# Patient Record
Sex: Male | Born: 1970
Health system: Southern US, Community
[De-identification: ages and names within clinical notes are randomized; demographics above are authoritative.]

## PROBLEM LIST (undated history)

## (undated) DIAGNOSIS — C801 Malignant (primary) neoplasm, unspecified: Secondary | ICD-10-CM

## (undated) DIAGNOSIS — R310 Gross hematuria: Secondary | ICD-10-CM

## (undated) DIAGNOSIS — N3289 Other specified disorders of bladder: Secondary | ICD-10-CM

## (undated) HISTORY — PX: WISDOM TOOTH EXTRACTION: SHX21

---

## 2004-04-19 HISTORY — PX: TONSILLECTOMY AND ADENOIDECTOMY: SHX28

## 2011-06-04 ENCOUNTER — Ambulatory Visit (INDEPENDENT_AMBULATORY_CARE_PROVIDER_SITE_OTHER): Payer: BC Managed Care – PPO | Admitting: Family Medicine

## 2011-06-04 ENCOUNTER — Encounter: Payer: Self-pay | Admitting: Family Medicine

## 2011-06-04 ENCOUNTER — Ambulatory Visit
Admission: RE | Admit: 2011-06-04 | Discharge: 2011-06-04 | Disposition: A | Discharge status: Home / Self Care | Payer: BC Managed Care – PPO | Source: Ambulatory Visit | Attending: Family Medicine | Admitting: Family Medicine

## 2011-06-04 VITALS — BP 147/96 | HR 72 | Ht 70.0 in | Wt 320.0 lb

## 2011-06-04 DIAGNOSIS — M7650 Patellar tendinitis, unspecified knee: Secondary | ICD-10-CM

## 2011-06-04 MED ORDER — KETOPROFEN POWD: Status: DC

## 2011-06-04 NOTE — Progress Notes (Signed)
  Subjective:    Patient ID: Jesus Soto, male    DOB: 28-Mar-1971, 41 y.o.   MRN: [id number]  HPI  Pain on anterior left knee after hitting a post while playing tennis 2 weeks ago, he was able to continue playing. He has been using motrin and ice which has helped. Last Friday he went for a 3 mile walk and his knee pain got worse. He described the pain in the anterior aspect of the knee, 3 intensity, worse with direct out and certain activities. He is able to walk and function of his knee is good. Mild swelling in the front of his knee. No numbness or tingling  There is no problem list on file for this patient.  No current outpatient prescriptions on file prior to visit.   No Known Allergies     Review of Systems  Constitutional: Negative for fever, chills, diaphoresis and fatigue.  Musculoskeletal: Negative for myalgias, back pain, joint swelling and gait problem.  Neurological: Positive for numbness. Negative for weakness.       Objective:   Physical Exam  Constitutional: He is oriented to person, place, and time. He appears well-developed and well-nourished.       BP 147/96  Pulse 72  Ht 5\' 10"  (1.778 m)  Wt 320 lb (145.151 kg)  BMI 45.92 kg/m2   Pulmonary/Chest: Effort normal.  Musculoskeletal:       Left knee with intact the skin. Through range of motion. TTP the patient in the inferior pole of the patella, tenderness to palpation along the  proximal patellar tendon. Mild tenderness to palpation in the medial joint line. Ligaments are intact. Neurovascularly intact Gait independent without a limp  Neurological: He is alert and oriented to person, place, and time.  Skin: Skin is warm. No rash noted. No erythema. No pallor.  Psychiatric: He has a normal mood and affect. His behavior is normal.    MSK U/S : The patellar tendon is intact. Teres and mild hyperechoic halo around the patellar tendon. There is any interruption in the anterior cortical of the patella  subjective both a anterior cortex patella avulsion fracture.  No joint effusion. The medial meniscus looks intact      Assessment & Plan:   1. Tendonitis, patellar  DG Knee AP/LAT W/Sunrise Left, Ketoprofen POWD   Recommended ice massage 20 min bid Ketoprofen compound Patellar tendinitis exercises. Use patellar strap daily  X ray of the knee show a avulsion fracture of a old patellar calcification.

## 2013-02-25 ENCOUNTER — Ambulatory Visit: Payer: BC Managed Care – PPO

## 2013-05-09 ENCOUNTER — Encounter: Payer: Self-pay | Admitting: Physician Assistant

## 2013-05-09 ENCOUNTER — Ambulatory Visit (INDEPENDENT_AMBULATORY_CARE_PROVIDER_SITE_OTHER): Payer: BC Managed Care – PPO | Admitting: Physician Assistant

## 2013-05-09 VITALS — BP 140/100 | HR 65 | Temp 97.9°F | Resp 16 | Ht 68.5 in | Wt 332.0 lb

## 2013-05-09 DIAGNOSIS — Z0000 Encounter for general adult medical examination without abnormal findings: Secondary | ICD-10-CM

## 2013-05-09 DIAGNOSIS — J329 Chronic sinusitis, unspecified: Secondary | ICD-10-CM

## 2013-05-09 DIAGNOSIS — M533 Sacrococcygeal disorders, not elsewhere classified: Secondary | ICD-10-CM

## 2013-05-09 NOTE — Progress Notes (Signed)
Pre visit review using our clinic review tool, if applicable. No additional management support is needed unless otherwise documented below in the visit note. 

## 2013-05-09 NOTE — Progress Notes (Signed)
Patient ID: Jesus Soto, male   DOB: [birth year], 43 y.o.   MRN: [id number]  Patient presents to clinic today to establish care.  Acute Concerns: Patient presents "butt-bone" tenderness with prolonged sitting that has been present for around 4 months.  Patient does have a very sedentary job. Patient is morbidly obese with Body mass index is 49.74 kg/(m^2). Patient endorses pain improves with standing and ambulation.  Denies trauma or injury.  Denies numbness or tingling.  Has not taken anything for symptoms.  Patient also c/o sinus pressure x 2 days.  Denies fever, chills, shortness of breath or wheezing.  Denies ear pain, tooth pain, sick contact or recent travel.  Chronic Issues: Denies known significant PMH.  Health Maintenance: Dental -- UTD Vision --UTD Immunizations -- declined flu shot.  History reviewed. No pertinent past medical history.  Past Surgical History  Procedure Laterality Date  . Tonsillectomy and adenoidectomy  2004    Pt states he also had his uvula shaved at this time.    No current outpatient prescriptions on file prior to visit.   No current facility-administered medications on file prior to visit.    No Known Allergies  Family History  Problem Relation Age of Onset  . Cancer Mother     ovarian  . Multiple myeloma Father   . Cancer Maternal Uncle     PROSTATE  . Cancer Paternal Uncle     PROSTATE  . Parkinson's disease Paternal Uncle   . Heart disease Paternal Grandmother     History   Social History  . Marital Status: Married    Spouse Name: N/A    Number of Children: N/A  . Years of Education: N/A   Occupational History  . Not on file.   Social History Main Topics  . Smoking status: Former Smoker    Quit date: 02/17/2010  . Smokeless tobacco: Never Used  . Alcohol Use: Yes     Comment: rare  . Drug Use: No  . Sexual Activity: Yes    Birth Control/ Protection: None     Comment: women -- wife   Other Topics Concern  . Not on  file   Social History Narrative  . No narrative on file   Review of Systems  Constitutional: Negative for fever and weight loss.  HENT: Negative for ear discharge, ear pain, hearing loss and tinnitus.   Eyes: Negative for blurred vision, double vision, photophobia and pain.  Respiratory: Negative for cough and shortness of breath.   Cardiovascular: Negative for chest pain and palpitations.  Gastrointestinal: Negative for heartburn, nausea, vomiting, abdominal pain, diarrhea, constipation, blood in stool and melena.  Genitourinary: Negative for dysuria, urgency, frequency, hematuria and flank pain.       Nocturia x 0; no problems with erectile function  Musculoskeletal: Positive for joint pain.  Neurological: Negative for dizziness, seizures, loss of consciousness and headaches.  Endo/Heme/Allergies: Negative for environmental allergies.  Psychiatric/Behavioral: Negative for depression, suicidal ideas, hallucinations and substance abuse. The patient is not nervous/anxious and does not have insomnia.     BP 140/100  Pulse 65  Temp(Src) 97.9 F (36.6 C) (Oral)  Resp 16  Ht 5' 8.5" (1.74 m)  Wt 332 lb (150.594 kg)  BMI 49.74 kg/m2  SpO2 99%  Physical Exam  Constitutional: He is oriented to person, place, and time and well-developed, well-nourished, and in no distress.  HENT:  Head: Normocephalic and atraumatic.  Right Ear: External ear normal.  Left Ear: External ear normal.  Nose: Nose normal.  Mouth/Throat: Oropharynx is clear and moist. No oropharyngeal exudate.  TM within normal limits bilaterally.  No tenderness to percussion of sinuses.  Eyes: Conjunctivae and EOM are normal. Pupils are equal, round, and reactive to light.  Neck: Neck supple.  Cardiovascular: Normal rate, regular rhythm, normal heart sounds and intact distal pulses.   Pulmonary/Chest: Effort normal and breath sounds normal. No respiratory distress. He has no wheezes. He has no rales. He exhibits no  tenderness.  Abdominal: Soft. Bowel sounds are normal. He exhibits no distension and no mass. There is no tenderness. There is no rebound and no guarding.  Musculoskeletal:       Cervical back: Normal.       Thoracic back: Normal.       Lumbar back: Normal.  Lymphadenopathy:    He has no cervical adenopathy.  Neurological: He is alert and oriented to person, place, and time. No cranial nerve deficit.  Skin: Skin is warm and dry. No rash noted.  Psychiatric: Affect normal.    No results found for this or any previous visit (from the past 2160 hour(s)).  Assessment/Plan: Sinusitis 2 days of symptoms.  Most likely viral. Increase fluid intake.  Rest.  Saline nasal spray.  Mucinex. Humidifier in bedroom.  Call or return to clinic if symptoms are not improving.  Visit for preventive health examination History reviewed.  Declines immunizations.  Will obtain fasting lipid profile.  Coccygeal pain Likely 2/2 morbid obesity and sedentary lifestyle.  Patient instructed on stretching exercises.  Donut pillow.  Encourage exercise and weight loss.  Alternate tylenol and ibuprofen.  Will obtain x-ray of coccyx if symptoms are not improving with conservative measures.

## 2013-05-09 NOTE — Patient Instructions (Signed)
Please return in 2 weeks for a blood pressure recheck.  Please read information below on the DASH diet for high blood pressure.  I recommend 30 minutes of exercise, at least 3 times per week to help with weight loss and reduction in BP.  For the "tail-bone" pain, lean slightly forward when sitting, as this alleviated pressure on the coccyx (tailbone).  Take an ibuprofen as needed. Again, weight loss will be very benficial in preventing a worsening of your symptoms.  Please let me know if symptoms are worsening.  Also, don't forget to return to lab for blood work.  I will call you with all of your results.  It was a pleasure participating in your care today.

## 2013-05-13 DIAGNOSIS — Z0000 Encounter for general adult medical examination without abnormal findings: Secondary | ICD-10-CM | POA: Insufficient documentation

## 2013-05-13 DIAGNOSIS — J329 Chronic sinusitis, unspecified: Secondary | ICD-10-CM | POA: Insufficient documentation

## 2013-05-13 DIAGNOSIS — M533 Sacrococcygeal disorders, not elsewhere classified: Secondary | ICD-10-CM | POA: Insufficient documentation

## 2013-05-13 NOTE — Assessment & Plan Note (Signed)
2 days of symptoms.  Most likely viral. Increase fluid intake.  Rest.  Saline nasal spray.  Mucinex. Humidifier in bedroom.  Call or return to clinic if symptoms are not improving.

## 2013-05-13 NOTE — Assessment & Plan Note (Signed)
Likely 2/2 morbid obesity and sedentary lifestyle.  Patient instructed on stretching exercises.  Donut pillow.  Encourage exercise and weight loss.  Alternate tylenol and ibuprofen.  Will obtain x-ray of coccyx if symptoms are not improving with conservative measures.

## 2013-05-13 NOTE — Assessment & Plan Note (Signed)
History reviewed.  Declines immunizations.  Will obtain fasting lipid profile.

## 2013-05-21 LAB — CBC WITH DIFFERENTIAL/PLATELET
BASOS ABS: 0 10*3/uL (ref 0.0–0.1)
BASOS PCT: 1 % (ref 0–1)
EOS ABS: 0.1 10*3/uL (ref 0.0–0.7)
Eosinophils Relative: 2 % (ref 0–5)
HCT: 44.8 % (ref 39.0–52.0)
HEMOGLOBIN: 15.9 g/dL (ref 13.0–17.0)
Lymphocytes Relative: 31 % (ref 12–46)
Lymphs Abs: 1.8 10*3/uL (ref 0.7–4.0)
MCH: 30.2 pg (ref 26.0–34.0)
MCHC: 35.5 g/dL (ref 30.0–36.0)
MCV: 85 fL (ref 78.0–100.0)
MONOS PCT: 7 % (ref 3–12)
Monocytes Absolute: 0.4 10*3/uL (ref 0.1–1.0)
NEUTROS PCT: 59 % (ref 43–77)
Neutro Abs: 3.5 10*3/uL (ref 1.7–7.7)
Platelets: 152 10*3/uL (ref 150–400)
RBC: 5.27 MIL/uL (ref 4.22–5.81)
RDW: 14 % (ref 11.5–15.5)
WBC: 5.9 10*3/uL (ref 4.0–10.5)

## 2013-05-21 LAB — HEMOGLOBIN A1C
HEMOGLOBIN A1C: 5.3 % (ref <5.7)
Mean Plasma Glucose: 105 mg/dL (ref <117)

## 2013-05-21 LAB — BASIC METABOLIC PANEL WITH GFR
BUN: 17 mg/dL (ref 6–23)
CALCIUM: 8.9 mg/dL (ref 8.4–10.5)
CO2: 26 mEq/L (ref 19–32)
Chloride: 105 mEq/L (ref 96–112)
Creat: 0.9 mg/dL (ref 0.50–1.35)
GLUCOSE: 99 mg/dL (ref 70–99)
Potassium: 4 mEq/L (ref 3.5–5.3)
SODIUM: 138 meq/L (ref 135–145)

## 2013-05-21 LAB — LIPID PANEL
Cholesterol: 221 mg/dL — ABNORMAL HIGH (ref 0–200)
HDL: 28 mg/dL — ABNORMAL LOW (ref >39)
Total CHOL/HDL Ratio: 7.9 Ratio
Triglycerides: 496 mg/dL — ABNORMAL HIGH (ref <150)

## 2013-05-21 LAB — HEPATIC FUNCTION PANEL
ALT: 26 U/L (ref 0–53)
AST: 20 U/L (ref 0–37)
Albumin: 4 g/dL (ref 3.5–5.2)
Alkaline Phosphatase: 75 U/L (ref 39–117)
BILIRUBIN DIRECT: 0.1 mg/dL (ref 0.0–0.3)
Indirect Bilirubin: 0.6 mg/dL (ref 0.2–1.2)
TOTAL PROTEIN: 6.4 g/dL (ref 6.0–8.3)
Total Bilirubin: 0.7 mg/dL (ref 0.2–1.2)

## 2013-05-21 LAB — TSH: TSH: 2.198 u[IU]/mL (ref 0.350–4.500)

## 2013-05-22 LAB — URINALYSIS, ROUTINE W REFLEX MICROSCOPIC
BILIRUBIN URINE: NEGATIVE
Glucose, UA: NEGATIVE mg/dL
Hgb urine dipstick: NEGATIVE
KETONES UR: NEGATIVE mg/dL
Leukocytes, UA: NEGATIVE
Nitrite: NEGATIVE
PROTEIN: NEGATIVE mg/dL
Specific Gravity, Urine: 1.022 (ref 1.005–1.030)
UROBILINOGEN UA: 0.2 mg/dL (ref 0.0–1.0)
pH: 5.5 (ref 5.0–8.0)

## 2013-05-23 ENCOUNTER — Ambulatory Visit: Payer: BC Managed Care – PPO | Admitting: Physician Assistant

## 2013-05-23 ENCOUNTER — Telehealth: Payer: Self-pay | Admitting: Physician Assistant

## 2013-05-23 DIAGNOSIS — E781 Pure hyperglyceridemia: Secondary | ICD-10-CM

## 2013-05-23 MED ORDER — ATORVASTATIN CALCIUM 20 MG PO TABS: 20.0000 mg | ORAL_TABLET | Freq: Every day | ORAL | Status: DC

## 2013-05-23 NOTE — Telephone Encounter (Signed)
Patient did not show up for his appointment on 05/23/13.  I was going to go over his recent labs with him.  Overall, labs look good.  He did have extremely elevated triglycerides and his LDL (lousy) cholesterol could not be calculated because his triglycerides were so elevated.  I need for him to return to lab fasting so we can recheck his cholesterol levels (I have placed the orders).  For his high triglycerides I am placing him on Lipitor 20 mg daily.  He needs to avoid sugary foods, foods high in cholesterol, and avoid alcohol consumption.  It is important that we get his triglycerides lowered because elevated TGL are associated with pancreatitis.  Follow-up in 1 week for BP recheck since he missed his appointment.  We can further discuss results and medications at that time.

## 2013-05-24 NOTE — Telephone Encounter (Signed)
Patient came in to office for appt on wrong date on 02.05.15 [appt was 02.04.15 at 5:15pm], went over results and discussed new medication; rescheduled for Wed, 02.18.15 at 5:30p/SLS

## 2013-06-06 ENCOUNTER — Ambulatory Visit (INDEPENDENT_AMBULATORY_CARE_PROVIDER_SITE_OTHER): Payer: BC Managed Care – PPO | Admitting: Physician Assistant

## 2013-06-06 ENCOUNTER — Encounter: Payer: Self-pay | Admitting: Physician Assistant

## 2013-06-06 VITALS — BP 134/98 | HR 72 | Temp 98.4°F | Resp 16 | Ht 68.5 in | Wt 331.2 lb

## 2013-06-06 DIAGNOSIS — E781 Pure hyperglyceridemia: Secondary | ICD-10-CM

## 2013-06-06 DIAGNOSIS — I10 Essential (primary) hypertension: Secondary | ICD-10-CM | POA: Insufficient documentation

## 2013-06-06 NOTE — Assessment & Plan Note (Addendum)
Discussed new diagnoses with patient, including treatment options. Patient has elected to attempt a two-month trial of therapeutic lifestyle changes. Patient given handout on hypertension and DASH diet. Encourage 30 minutes of aerobic exercise at least 3 times a week. Followup in 2 months.  If BP persistently elevated despite therapeutic lifestyle changes, we will need to initiate an antihypertensive agent

## 2013-06-06 NOTE — Progress Notes (Signed)
Pre visit review using our clinic review tool, if applicable. No additional management support is needed unless otherwise documented below in the visit note/SLS  

## 2013-06-06 NOTE — Patient Instructions (Signed)
Please return for fasting labs.  This is very important.  Please read information below on Hypertension and the DASH diet.  We will follow-up in 1-2 months.  If BP is still elevated despite lifestyle changes we will need to consider medication for BP to reduce risk of stroke and heart attack.  Hypertension As your heart beats, it forces blood through your arteries. This force is your blood pressure. If the pressure is too high, it is called hypertension (HTN) or high blood pressure. HTN is dangerous because you may have it and not know it. High blood pressure may mean that your heart has to work harder to pump blood. Your arteries may be narrow or stiff. The extra work puts you at risk for heart disease, stroke, and other problems.  Blood pressure consists of two numbers, a higher number over a lower, 110/72, for example. It is stated as "110 over 72." The ideal is below 120 for the top number (systolic) and under 80 for the bottom (diastolic). Write down your blood pressure today. You should pay close attention to your blood pressure if you have certain conditions such as:  Heart failure.  Prior heart attack.  Diabetes  Chronic kidney disease.  Prior stroke.  Multiple risk factors for heart disease. To see if you have HTN, your blood pressure should be measured while you are seated with your arm held at the level of the heart. It should be measured at least twice. A one-time elevated blood pressure reading (especially in the Emergency Department) does not mean that you need treatment. There may be conditions in which the blood pressure is different between your right and left arms. It is important to see your caregiver soon for a recheck. Most people have essential hypertension which means that there is not a specific cause. This type of high blood pressure may be lowered by changing lifestyle factors such as:  Stress.  Smoking.  Lack of exercise.  Excessive  weight.  Drug/tobacco/alcohol use.  Eating less salt. Most people do not have symptoms from high blood pressure until it has caused damage to the body. Effective treatment can often prevent, delay or reduce that damage. TREATMENT  When a cause has been identified, treatment for high blood pressure is directed at the cause. There are a large number of medications to treat HTN. These fall into several categories, and your caregiver will help you select the medicines that are best for you. Medications may have side effects. You should review side effects with your caregiver. If your blood pressure stays high after you have made lifestyle changes or started on medicines,   Your medication(s) may need to be changed.  Other problems may need to be addressed.  Be certain you understand your prescriptions, and know how and when to take your medicine.  Be sure to follow up with your caregiver within the time frame advised (usually within two weeks) to have your blood pressure rechecked and to review your medications.  If you are taking more than one medicine to lower your blood pressure, make sure you know how and at what times they should be taken. Taking two medicines at the same time can result in blood pressure that is too low. SEEK IMMEDIATE MEDICAL CARE IF:  You develop a severe headache, blurred or changing vision, or confusion.  You have unusual weakness or numbness, or a faint feeling.  You have severe chest or abdominal pain, vomiting, or breathing problems. MAKE SURE YOU:  Understand these instructions.  Will watch your condition.  Will get help right away if you are not doing well or get worse. Document Released: 04/05/2005 Document Revised: 06/28/2011 Document Reviewed: 11/24/2007 [facility] Patient Information 2014 [place].  DASH Diet The DASH diet stands for "Dietary Approaches to Stop Hypertension." It is a healthy eating plan that has been shown to reduce high  blood pressure (hypertension) in as little as 14 days, while also possibly providing other significant health benefits. These other health benefits include reducing the risk of breast cancer after menopause and reducing the risk of type 2 diabetes, heart disease, colon cancer, and stroke. Health benefits also include weight loss and slowing kidney failure in patients with chronic kidney disease.  DIET GUIDELINES  Limit salt (sodium). Your diet should contain less than 1500 mg of sodium daily.  Limit refined or processed carbohydrates. Your diet should include mostly whole grains. Desserts and added sugars should be used sparingly.  Include small amounts of heart-healthy fats. These types of fats include nuts, oils, and tub margarine. Limit saturated and trans fats. These fats have been shown to be harmful in the body. CHOOSING FOODS  The following food groups are based on a 2000 calorie diet. See your Registered Dietitian for individual calorie needs. Grains and Grain Products (6 to 8 servings daily)  Eat More Often: Whole-wheat bread, brown rice, whole-grain or wheat pasta, quinoa, popcorn without added fat or salt (air popped).  Eat Less Often: White bread, white pasta, white rice, cornbread. Vegetables (4 to 5 servings daily)  Eat More Often: Fresh, frozen, and canned vegetables. Vegetables may be raw, steamed, roasted, or grilled with a minimal amount of fat.  Eat Less Often/Avoid: Creamed or fried vegetables. Vegetables in a cheese sauce. Fruit (4 to 5 servings daily)  Eat More Often: All fresh, canned (in natural juice), or frozen fruits. Dried fruits without added sugar. One hundred percent fruit juice ( cup [237 mL] daily).  Eat Less Often: Dried fruits with added sugar. Canned fruit in light or heavy syrup. [employer], Fish, and Poultry (2 servings or less daily. One serving is 3 to 4 oz [85-114 g]).  Eat More Often: Ninety percent or leaner ground beef, tenderloin, sirloin.  Round cuts of beef, chicken breast, [place] breast. All fish. Grill, bake, or broil your meat. Nothing should be fried.  Eat Less Often/Avoid: Fatty cuts of meat, [place], or chicken leg, thigh, or wing. Fried cuts of meat or fish. Dairy (2 to 3 servings)  Eat More Often: Low-fat or fat-free milk, low-fat plain or light yogurt, reduced-fat or part-skim cheese.  Eat Less Often/Avoid: Milk (whole, 2%).Whole milk yogurt. Full-fat cheeses. Nuts, Seeds, and Legumes (4 to 5 servings per week)  Eat More Often: All without added salt.  Eat Less Often/Avoid: Salted nuts and seeds, canned beans with added salt. Fats and Sweets (limited)  Eat More Often: Vegetable oils, tub margarines without trans fats, sugar-free gelatin. Mayonnaise and salad dressings.  Eat Less Often/Avoid: Coconut oils, palm oils, butter, stick margarine, cream, half and half, cookies, candy, pie. FOR MORE INFORMATION The Dash Diet Eating Plan: www.[email or web address].org Document Released: 03/25/2011 Document Revised: 06/28/2011 Document Reviewed: 03/25/2011 [facility] Patient Information 2014 [place], [place].  Hypertriglyceridemia  Diet for High blood levels of Triglycerides Most fats in food are triglycerides. Triglycerides in your blood are stored as fat in your body. High levels of triglycerides in your blood may put you at a greater risk for heart disease and stroke.  Normal  triglyceride levels are less than 150 mg/dL. Borderline high levels are 150-199 mg/dl. High levels are 200 - 499 mg/dL, and very high triglyceride levels are greater than 500 mg/dL. The decision to treat high triglycerides is generally based on the level. For people with borderline or high triglyceride levels, treatment includes weight loss and exercise. Drugs are recommended for people with very high triglyceride levels. Many people who need treatment for high triglyceride levels have metabolic syndrome. This syndrome is a collection of disorders that often  include: insulin resistance, high blood pressure, blood clotting problems, high cholesterol and triglycerides. TESTING PROCEDURE FOR TRIGLYCERIDES  You should not eat 4 hours before getting your triglycerides measured. The normal range of triglycerides is between 10 and 250 milligrams per deciliter (mg/dl). Some people may have extreme levels (1000 or above), but your triglyceride level may be too high if it is above 150 mg/dl, depending on what other risk factors you have for heart disease.  People with high blood triglycerides may also have high blood cholesterol levels. If you have high blood cholesterol as well as high blood triglycerides, your risk for heart disease is probably greater than if you only had high triglycerides. High blood cholesterol is one of the main risk factors for heart disease. CHANGING YOUR DIET  Your weight can affect your blood triglyceride level. If you are more than 20% above your ideal body weight, you may be able to lower your blood triglycerides by losing weight. Eating less and exercising regularly is the best way to combat this. Fat provides more calories than any other food. The best way to lose weight is to eat less fat. Only 30% of your total calories should come from fat. Less than 7% of your diet should come from saturated fat. A diet low in fat and saturated fat is the same as a diet to decrease blood cholesterol. By eating a diet lower in fat, you may lose weight, lower your blood cholesterol, and lower your blood triglyceride level.  Eating a diet low in fat, especially saturated fat, may also help you lower your blood triglyceride level. Ask your dietitian to help you figure how much fat you can eat based on the number of calories your caregiver has prescribed for you.  Exercise, in addition to helping with weight loss may also help lower triglyceride levels.   Alcohol can increase blood triglycerides. You may need to stop drinking alcoholic beverages.  Too  much carbohydrate in your diet may also increase your blood triglycerides. Some complex carbohydrates are necessary in your diet. These may include bread, rice, potatoes, other starchy vegetables and cereals.  Reduce "simple" carbohydrates. These may include pure sugars, candy, honey, and jelly without losing other nutrients. If you have the kind of high blood triglycerides that is affected by the amount of carbohydrates in your diet, you will need to eat less sugar and less high-sugar foods. Your caregiver can help you with this.  Adding 2-4 grams of fish oil (EPA+ DHA) may also help lower triglycerides. Speak with your caregiver before adding any supplements to your regimen. Following the Diet  Maintain your ideal weight. Your caregivers can help you with a diet. Generally, eating less food and getting more exercise will help you lose weight. Joining a weight control group may also help. Ask your caregivers for a good weight control group in your area.  Eat low-fat foods instead of high-fat foods. This can help you lose weight too.  These foods are lower  in fat. Eat MORE of these:   Dried beans, peas, and lentils.  Egg whites.  Low-fat cottage cheese.  Fish.  Lean cuts of meat, such as round, sirloin, rump, and flank (cut extra fat off meat you fix).  Whole grain breads, cereals and pasta.  Skim and nonfat dry milk.  Low-fat yogurt.  Poultry without the skin.  Cheese made with skim or part-skim milk, such as mozzarella, parmesan, farmers', ricotta, or pot cheese. These are higher fat foods. Eat LESS of these:   Whole milk and foods made from whole milk, such as American, blue, cheddar, monterey jack, and swiss cheese  High-fat meats, such as luncheon meats, sausages, knockwurst, bratwurst, hot dogs, ribs, corned beef, ground pork, and regular ground beef.  Fried foods. Limit saturated fats in your diet. Substituting unsaturated fat for saturated fat may decrease your blood  triglyceride level. You will need to read package labels to know which products contain saturated fats.  These foods are high in saturated fat. Eat LESS of these:   Fried pork skins.  Whole milk.  Skin and fat from poultry.  Palm oil.  Butter.  Shortening.  Cream cheese.  Berniece Salines.  Margarines and baked goods made from listed oils.  Vegetable shortenings.  Chitterlings.  Fat from meats.  Coconut oil.  Palm kernel oil.  Lard.  Cream.  Sour cream.  Fatback.  Coffee whiteners and non-dairy creamers made with these oils.  Cheese made from whole milk. Use unsaturated fats (both polyunsaturated and monounsaturated) moderately. Remember, even though unsaturated fats are better than saturated fats; you still want a diet low in total fat.  These foods are high in unsaturated fat:   Canola oil.  Sunflower oil.  Mayonnaise.  Almonds.  Peanuts.  Pine nuts.  Margarines made with these oils.  Safflower oil.  Olive oil.  Avocados.  Cashews.  Peanut butter.  Sunflower seeds.  Soybean oil.  Peanut oil.  Olives.  Pecans.  Walnuts.  Pumpkin seeds. Avoid sugar and other high-sugar foods. This will decrease carbohydrates without decreasing other nutrients. Sugar in your food goes rapidly to your blood. When there is excess sugar in your blood, your liver may use it to make more triglycerides. Sugar also contains calories without other important nutrients.  Eat LESS of these:   Sugar, brown sugar, powdered sugar, jam, jelly, preserves, honey, syrup, molasses, pies, candy, cakes, cookies, frosting, pastries, colas, soft drinks, punches, fruit drinks, and regular gelatin.  Avoid alcohol. Alcohol, even more than sugar, may increase blood triglycerides. In addition, alcohol is high in calories and low in nutrients. Ask for sparkling water, or a diet soft drink instead of an alcoholic beverage. Suggestions for planning and preparing meals   Bake, broil,  grill or roast meats instead of frying.  Remove fat from meats and skin from poultry before cooking.  Add spices, herbs, lemon juice or vinegar to vegetables instead of salt, rich sauces or gravies.  Use a non-stick skillet without fat or use no-stick sprays.  Cool and refrigerate stews and broth. Then remove the hardened fat floating on the surface before serving.  Refrigerate meat drippings and skim off fat to make low-fat gravies.  Serve more fish.  Use less butter, margarine and other high-fat spreads on bread or vegetables.  Use skim or reconstituted non-fat dry milk for cooking.  Cook with low-fat cheeses.  Substitute low-fat yogurt or cottage cheese for all or part of the sour cream in recipes for sauces, dips or congealed  salads.  Use half yogurt/half mayonnaise in salad recipes.  Substitute evaporated skim milk for cream. Evaporated skim milk or reconstituted non-fat dry milk can be whipped and substituted for whipped cream in certain recipes.  Choose fresh fruits for dessert instead of high-fat foods such as pies or cakes. Fruits are naturally low in fat. When Dining Out   Order low-fat appetizers such as fruit or vegetable juice, pasta with vegetables or tomato sauce.  Select clear, rather than cream soups.  Ask that dressings and gravies be served on the side. Then use less of them.  Order foods that are baked, broiled, poached, steamed, stir-fried, or roasted.  Ask for margarine instead of butter, and use only a small amount.  Drink sparkling water, unsweetened tea or coffee, or diet soft drinks instead of alcohol or other sweet beverages. QUESTIONS AND ANSWERS ABOUT OTHER FATS IN THE BLOOD: SATURATED FAT, TRANS FAT, AND CHOLESTEROL What is trans fat? Trans fat is a type of fat that is formed when vegetable oil is hardened through a process called hydrogenation. This process helps makes foods more solid, gives them shape, and prolongs their shelf life. Trans  fats are also called hydrogenated or partially hydrogenated oils.  What do saturated fat, trans fat, and cholesterol in foods have to do with heart disease? Saturated fat, trans fat, and cholesterol in the diet all raise the level of LDL "bad" cholesterol in the blood. The higher the LDL cholesterol, the greater the risk for coronary heart disease (CHD). Saturated fat and trans fat raise LDL similarly.  What foods contain saturated fat, trans fat, and cholesterol? High amounts of saturated fat are found in animal products, such as fatty cuts of meat, chicken skin, and full-fat dairy products like butter, whole milk, cream, and cheese, and in tropical vegetable oils such as palm, palm kernel, and coconut oil. Trans fat is found in some of the same foods as saturated fat, such as vegetable shortening, some margarines (especially hard or stick margarine), crackers, cookies, baked goods, fried foods, salad dressings, and other processed foods made with partially hydrogenated vegetable oils. Small amounts of trans fat also occur naturally in some animal products, such as milk products, beef, and lamb. Foods high in cholesterol include liver, other organ meats, egg yolks, shrimp, and full-fat dairy products. How can I use the new food label to make heart-healthy food choices? Check the Nutrition Facts panel of the food label. Choose foods lower in saturated fat, trans fat, and cholesterol. For saturated fat and cholesterol, you can also use the Percent Daily Value (%DV): 5% DV or less is low, and 20% DV or more is high. (There is no %DV for trans fat.) Use the Nutrition Facts panel to choose foods low in saturated fat and cholesterol, and if the trans fat is not listed, read the ingredients and limit products that list shortening or hydrogenated or partially hydrogenated vegetable oil, which tend to be high in trans fat. POINTS TO REMEMBER:   Discuss your risk for heart disease with your caregivers, and take  steps to reduce risk factors.  Change your diet. Choose foods that are low in saturated fat, trans fat, and cholesterol.  Add exercise to your daily routine if it is not already being done. Participate in physical activity of moderate intensity, like brisk walking, for at least 30 minutes on most, and preferably all days of the week. No time? Break the 30 minutes into three, 10-minute segments during the day.  Stop smoking.  If you do smoke, contact your caregiver to discuss ways in which they can help you quit.  Do not use street drugs.  Maintain a normal weight.  Maintain a healthy blood pressure.  Keep up with your blood work for checking the fats in your blood as directed by your caregiver. Document Released: 01/22/2004 Document Revised: 10/05/2011 Document Reviewed: 08/19/2008 [facility] Patient Information 2014 [place].

## 2013-06-06 NOTE — Progress Notes (Signed)
Patient presents to clinic today to discuss recent lab results and also for blood pressure recheck.  Patient recently presented to clinic for new patient appointment an annual exam with fasting labs. Patient was found to have elevated triglycerides. LDL was unable to be calculated due to increased level of triglycerides. Patient was to return for repeat fasting lipid profile and direct LDL measurement. Patient has not done so at present. Patient's BP 134/98 in clinic today. Previous BP recordings with significantly elevated diastolic blood pressure.  Diagnosis of hypertension can now be established.  Patient denies headache, vision changes, chest pain, shortness of breath, palpitations, lightheadedness or dizziness. Recent TSH level within normal limits. Patient does endorse poor diet and lack of exercise.  No past medical history on file.  Current Outpatient Prescriptions on File Prior to Visit  Medication Sig Dispense Refill  . atorvastatin (LIPITOR) 20 MG tablet Take 1 tablet (20 mg total) by mouth daily.  90 tablet  0   No current facility-administered medications on file prior to visit.    No Known Allergies  Family History  Problem Relation Age of Onset  . Cancer Mother     ovarian  . Multiple myeloma Father   . Cancer Maternal Uncle     PROSTATE  . Cancer Paternal Uncle     PROSTATE  . Parkinson's disease Paternal Uncle   . Heart disease Paternal Grandmother     History   Social History  . Marital Status: Married    Spouse Name: N/A    Number of Children: N/A  . Years of Education: N/A   Social History Main Topics  . Smoking status: Former Smoker    Quit date: 02/17/2010  . Smokeless tobacco: Never Used  . Alcohol Use: Yes     Comment: rare  . Drug Use: No  . Sexual Activity: Yes    Birth Control/ Protection: None     Comment: women -- wife   Other Topics Concern  . None   Social History Narrative  . None   Review of Systems - See HPI.  All other ROS are  negative.  BP 134/98  Pulse 72  Temp(Src) 98.4 F (36.9 C) (Oral)  Resp 16  Ht 5' 8.5" (1.74 m)  Wt 331 lb 4 oz (150.254 kg)  BMI 49.63 kg/m2  SpO2 98%  Physical Exam  Vitals reviewed. Constitutional: He is oriented to person, place, and time and well-developed, well-nourished, and in no distress.  HENT:  Head: Normocephalic and atraumatic.  Eyes: Conjunctivae are normal. Pupils are equal, round, and reactive to light.  Neck: Neck supple.  Cardiovascular: Normal rate, regular rhythm, normal heart sounds and intact distal pulses.   Pulmonary/Chest: Effort normal and breath sounds normal. No respiratory distress. He has no wheezes. He has no rales. He exhibits no tenderness.  Neurological: He is alert and oriented to person, place, and time.  Skin: Skin is warm and dry. No rash noted.  Psychiatric: Affect normal.    Recent Results (from the past 2160 hour(s))  CBC WITH DIFFERENTIAL     Status: None   Collection Time    05/21/13  8:09 AM      Result Value Ref Range   WBC 5.9  4.0 - 10.5 K/uL   RBC 5.27  4.22 - 5.81 MIL/uL   Hemoglobin 15.9  13.0 - 17.0 g/dL   HCT 44.8  39.0 - 52.0 %   MCV 85.0  78.0 - 100.0 fL   MCH 30.2  26.0 - 34.0 pg   MCHC 35.5  30.0 - 36.0 g/dL   RDW 14.0  11.5 - 15.5 %   Platelets 152  150 - 400 K/uL   Neutrophils Relative % 59  43 - 77 %   Neutro Abs 3.5  1.7 - 7.7 K/uL   Lymphocytes Relative 31  12 - 46 %   Lymphs Abs 1.8  0.7 - 4.0 K/uL   Monocytes Relative 7  3 - 12 %   Monocytes Absolute 0.4  0.1 - 1.0 K/uL   Eosinophils Relative 2  0 - 5 %   Eosinophils Absolute 0.1  0.0 - 0.7 K/uL   Basophils Relative 1  0 - 1 %   Basophils Absolute 0.0  0.0 - 0.1 K/uL   Smear Review Criteria for review not met    BASIC METABOLIC PANEL WITH GFR     Status: None   Collection Time    05/21/13  8:09 AM      Result Value Ref Range   Sodium 138  135 - 145 mEq/L   Potassium 4.0  3.5 - 5.3 mEq/L   Chloride 105  96 - 112 mEq/L   CO2 26  19 - 32 mEq/L    Glucose, Bld 99  70 - 99 mg/dL   BUN 17  6 - 23 mg/dL   Creat 0.90  0.50 - 1.35 mg/dL   Calcium 8.9  8.4 - 10.5 mg/dL   GFR, Est African American >89     GFR, Est Non African American >89     Comment:       The estimated GFR is a calculation valid for adults (>=14 years old)     that uses the CKD-EPI algorithm to adjust for age and sex. It is       not to be used for children, pregnant women, hospitalized patients,        patients on dialysis, or with rapidly changing kidney function.     According to the NKDEP, eGFR >89 is normal, 60-89 shows mild     impairment, 30-59 shows moderate impairment, 15-29 shows severe     impairment and <15 is ESRD.        HEPATIC FUNCTION PANEL     Status: None   Collection Time    05/21/13  8:09 AM      Result Value Ref Range   Total Bilirubin 0.7  0.2 - 1.2 mg/dL   Comment: ** Please note change in reference range(s). **   Bilirubin, Direct 0.1  0.0 - 0.3 mg/dL   Indirect Bilirubin 0.6  0.2 - 1.2 mg/dL   Comment: ** Please note change in reference range(s). **   Alkaline Phosphatase 75  39 - 117 U/L   AST 20  0 - 37 U/L   ALT 26  0 - 53 U/L   Total Protein 6.4  6.0 - 8.3 g/dL   Albumin 4.0  3.5 - 5.2 g/dL  TSH     Status: None   Collection Time    05/21/13  8:09 AM      Result Value Ref Range   TSH 2.198  0.350 - 4.500 uIU/mL  HEMOGLOBIN A1C     Status: None   Collection Time    05/21/13  8:09 AM      Result Value Ref Range   Hemoglobin A1C 5.3  <5.7 %   Comment:  According to the ADA Clinical Practice Recommendations for 2011, when     HbA1c is used as a screening test:             >=6.5%   Diagnostic of Diabetes Mellitus                (if abnormal result is confirmed)           5.7-6.4%   Increased risk of developing Diabetes Mellitus           References:Diagnosis and Classification of Diabetes Mellitus,Diabetes     JSHF,0263,78(HYIFO 1):S62-S69 and  Standards of Medical Care in             Diabetes - 2011,Diabetes YDXA,1287,86 (Suppl 1):S11-S61.         Mean Plasma Glucose 105  <117 mg/dL  URINALYSIS, ROUTINE W REFLEX MICROSCOPIC     Status: None   Collection Time    05/21/13  8:09 AM      Result Value Ref Range   Color, Urine YELLOW  YELLOW   APPearance CLEAR  CLEAR   Specific Gravity, Urine 1.022  1.005 - 1.030   pH 5.5  5.0 - 8.0   Glucose, UA NEG  NEG mg/dL   Bilirubin Urine NEG  NEG   Ketones, ur NEG  NEG mg/dL   Hgb urine dipstick NEG  NEG   Protein, ur NEG  NEG mg/dL   Urobilinogen, UA 0.2  0.0 - 1.0 mg/dL   Nitrite NEG  NEG   Leukocytes, UA NEG  NEG  LIPID PANEL     Status: Abnormal   Collection Time    05/21/13  8:09 AM      Result Value Ref Range   Cholesterol 221 (*) 0 - 200 mg/dL   Comment: ATP III Classification:           < 200        mg/dL        Desirable          200 - 239     mg/dL        Borderline High          >= 240        mg/dL        High         Triglycerides 496 (*) <150 mg/dL   HDL 28 (*) >39 mg/dL   Total CHOL/HDL Ratio 7.9     VLDL NOT CALC  0 - 40 mg/dL   Comment:       Not calculated due to Triglyceride >400.     Suggest ordering Direct LDL (Unit Code: [id number]).   LDL Cholesterol    0 - 99 mg/dL   Comment:       Not calculated due to Triglyceride >400.     Suggest ordering Direct LDL (Unit Code: [id number]).           Total Cholesterol/HDL Ratio:CHD Risk                            Coronary Heart Disease Risk Table                                            Men       Women  1/2 Average Risk              3.4        3.3                  Average Risk              5.0        4.4               2X Average Risk              9.6        7.1               3X Average Risk             23.4       11.0     Use the calculated Patient Ratio above and the CHD Risk table      to determine the patient's CHD Risk.     ATP III Classification (LDL):           < 100        mg/dL         Optimal           100 - 129     mg/dL         Near or Above Optimal          130 - 159     mg/dL         Borderline High          160 - 189     mg/dL         High           > 190        mg/dL         Very High          Assessment/Plan: Essential hypertension, benign Discussed new diagnoses with patient, including treatment options. Patient has elected to attempt a two-month trial of therapeutic lifestyle changes. Patient given handout on hypertension and DASH diet. Encourage 30 minutes of aerobic exercise at least 3 times a week. Followup in 2 months.  If BP persistently elevated despite therapeutic lifestyle changes, we will need to initiate an antihypertensive agent  Hypertriglyceridemia Repeat fasting lipid profile with direct LDL measurement. Avoid refined sugars. Increase aerobic exercise. Will initiate statin if clinically indicated.

## 2013-06-06 NOTE — Assessment & Plan Note (Signed)
Repeat fasting lipid profile with direct LDL measurement. Avoid refined sugars. Increase aerobic exercise. Will initiate statin if clinically indicated.

## 2013-06-07 ENCOUNTER — Telehealth: Payer: Self-pay | Admitting: Physician Assistant

## 2013-06-07 NOTE — Telephone Encounter (Signed)
Relevant patient education assigned to patient using Emmi. ° °

## 2013-07-04 ENCOUNTER — Ambulatory Visit: Payer: BC Managed Care – PPO | Admitting: Physician Assistant

## 2013-07-11 ENCOUNTER — Ambulatory Visit: Payer: BC Managed Care – PPO | Admitting: Physician Assistant

## 2013-07-18 ENCOUNTER — Ambulatory Visit: Payer: BC Managed Care – PPO | Admitting: Physician Assistant

## 2013-08-01 ENCOUNTER — Ambulatory Visit: Payer: BC Managed Care – PPO | Admitting: Physician Assistant

## 2013-08-15 ENCOUNTER — Encounter: Payer: Self-pay | Admitting: *Deleted

## 2013-08-15 ENCOUNTER — Ambulatory Visit: Payer: BC Managed Care – PPO | Admitting: Physician Assistant

## 2014-08-19 ENCOUNTER — Emergency Department (HOSPITAL_BASED_OUTPATIENT_CLINIC_OR_DEPARTMENT_OTHER)
Admission: EM | Admit: 2014-08-19 | Discharge: 2014-08-19 | Disposition: A | Discharge status: Home / Self Care | Payer: BLUE CROSS/BLUE SHIELD | Attending: Emergency Medicine | Admitting: Emergency Medicine

## 2014-08-19 ENCOUNTER — Encounter (HOSPITAL_BASED_OUTPATIENT_CLINIC_OR_DEPARTMENT_OTHER): Payer: Self-pay

## 2014-08-19 DIAGNOSIS — Z87891 Personal history of nicotine dependence: Secondary | ICD-10-CM | POA: Insufficient documentation

## 2014-08-19 DIAGNOSIS — R319 Hematuria, unspecified: Secondary | ICD-10-CM | POA: Diagnosis present

## 2014-08-19 DIAGNOSIS — Z79899 Other long term (current) drug therapy: Secondary | ICD-10-CM | POA: Diagnosis not present

## 2014-08-19 DIAGNOSIS — N390 Urinary tract infection, site not specified: Secondary | ICD-10-CM | POA: Diagnosis not present

## 2014-08-19 LAB — CBC WITH DIFFERENTIAL/PLATELET
BASOS ABS: 0 10*3/uL (ref 0.0–0.1)
Basophils Relative: 0 % (ref 0–1)
EOS PCT: 2 % (ref 0–5)
Eosinophils Absolute: 0.1 10*3/uL (ref 0.0–0.7)
HCT: 46 % (ref 39.0–52.0)
Hemoglobin: 16 g/dL (ref 13.0–17.0)
LYMPHS ABS: 1.7 10*3/uL (ref 0.7–4.0)
Lymphocytes Relative: 26 % (ref 12–46)
MCH: 29.5 pg (ref 26.0–34.0)
MCHC: 34.8 g/dL (ref 30.0–36.0)
MCV: 84.7 fL (ref 78.0–100.0)
MONO ABS: 0.4 10*3/uL (ref 0.1–1.0)
Monocytes Relative: 6 % (ref 3–12)
NEUTROS PCT: 66 % (ref 43–77)
Neutro Abs: 4.2 10*3/uL (ref 1.7–7.7)
Platelets: 157 10*3/uL (ref 150–400)
RBC: 5.43 MIL/uL (ref 4.22–5.81)
RDW: 12.6 % (ref 11.5–15.5)
WBC: 6.3 10*3/uL (ref 4.0–10.5)

## 2014-08-19 LAB — BASIC METABOLIC PANEL
ANION GAP: 5 (ref 5–15)
BUN: 16 mg/dL (ref 6–20)
CALCIUM: 8.7 mg/dL — AB (ref 8.9–10.3)
CHLORIDE: 111 mmol/L (ref 101–111)
CO2: 24 mmol/L (ref 22–32)
CREATININE: 1 mg/dL (ref 0.61–1.24)
GFR calc Af Amer: >60 mL/min (ref >60)
Glucose, Bld: 105 mg/dL — ABNORMAL HIGH (ref 70–99)
POTASSIUM: 4 mmol/L (ref 3.5–5.1)
Sodium: 140 mmol/L (ref 135–145)

## 2014-08-19 LAB — URINE MICROSCOPIC-ADD ON

## 2014-08-19 LAB — URINALYSIS, ROUTINE W REFLEX MICROSCOPIC
Bilirubin Urine: NEGATIVE
GLUCOSE, UA: NEGATIVE mg/dL
KETONES UR: 15 mg/dL — AB
Nitrite: NEGATIVE
Protein, ur: >300 mg/dL — AB
Specific Gravity, Urine: 1.021 (ref 1.005–1.030)
Urobilinogen, UA: 0.2 mg/dL (ref 0.0–1.0)
pH: 6 (ref 5.0–8.0)

## 2014-08-19 MED ORDER — CIPROFLOXACIN HCL 500 MG PO TABS: 500.0000 mg | ORAL_TABLET | Freq: Two times a day (BID) | ORAL | Status: DC

## 2014-08-19 NOTE — ED Provider Notes (Signed)
CSN: [id number]     Arrival date & time 08/19/14  1037 History   First MD Initiated Contact with Patient 08/19/14 1050     Chief Complaint  Patient presents with  . Hematuria     (Consider location/radiation/quality/duration/timing/severity/associated sxs/prior Treatment) HPI Comments: 44 year old male with history of high blood pressure presents with hematuria since this morning.  Patient has very mild dysuria. Patient had a brief episode few days back that resolved with fluids. Patient had urine infection as a child but no recent urine or kidney issues. Patient does not take NSAIDs regularly. No blood thinners. No prostate region tenderness. No abdominal pain or kidney stone history. Patient feels well otherwise, majority of the stream is light-colored blood.  Patient is a 44 y.o. male presenting with hematuria. The history is provided by the patient.  Hematuria Pertinent negatives include no chest pain, no abdominal pain, no headaches and no shortness of breath.    History reviewed. No pertinent past medical history. Past Surgical History  Procedure Laterality Date  . Tonsillectomy and adenoidectomy  2004    Pt states he also had his uvula shaved at this time.   Family History  Problem Relation Age of Onset  . Cancer Mother     ovarian  . Multiple myeloma Father   . Cancer Maternal Uncle     PROSTATE  . Cancer Paternal Uncle     PROSTATE  . Parkinson's disease Paternal Uncle   . Heart disease Paternal Grandmother    History  Substance Use Topics  . Smoking status: Former Smoker    Quit date: 02/17/2010  . Smokeless tobacco: Never Used  . Alcohol Use: Yes     Comment: rare    Review of Systems  Constitutional: Negative for fever and chills.  HENT: Negative for congestion.   Eyes: Negative for visual disturbance.  Respiratory: Negative for shortness of breath.   Cardiovascular: Negative for chest pain.  Gastrointestinal: Negative for vomiting and abdominal pain.   Genitourinary: Positive for dysuria and hematuria. Negative for flank pain.  Musculoskeletal: Negative for back pain, neck pain and neck stiffness.  Skin: Negative for rash.  Neurological: Negative for light-headedness and headaches.      Allergies  Review of patient's allergies indicates no known allergies.  Home Medications   Prior to Admission medications   Medication Sig Start Date End Date Taking? Authorizing Provider  atorvastatin (LIPITOR) 20 MG tablet Take 1 tablet (20 mg total) by mouth daily. 05/23/13   Brunetta Jeans, PA-C  ciprofloxacin (CIPRO) 500 MG tablet Take 1 tablet (500 mg total) by mouth 2 (two) times daily. One po bid x 7 days 08/19/14   Elnora Morrison, MD   BP 138/81 mmHg  Pulse 68  Temp(Src) 98.2 F (36.8 C) (Oral)  Resp 20  Ht 5' 10"  (1.778 m)  Wt 323 lb (146.512 kg)  BMI 46.35 kg/m2  SpO2 97% Physical Exam  Constitutional: He is oriented to person, place, and time. He appears well-developed and well-nourished.  HENT:  Head: Normocephalic and atraumatic.  Eyes: Conjunctivae are normal. Right eye exhibits no discharge. Left eye exhibits no discharge.  Neck: Normal range of motion. Neck supple. No tracheal deviation present.  Cardiovascular: Normal rate and regular rhythm.   Pulmonary/Chest: Effort normal and breath sounds normal.  Abdominal: Soft. He exhibits no distension. There is no tenderness. There is no guarding.  Genitourinary:  No prostate tenderness or significant enlargement on exam. Good rectal tone.  Musculoskeletal: He exhibits no edema.  Neurological: He is alert and oriented to person, place, and time.  Skin: Skin is warm. No rash noted.  Psychiatric: He has a normal mood and affect.  Nursing note and vitals reviewed.   ED Course  Procedures (including critical care time) Labs Review Labs Reviewed  URINALYSIS, ROUTINE W REFLEX MICROSCOPIC - Abnormal; Notable for the following:    Color, Urine RED (*)    APPearance TURBID (*)     Hgb urine dipstick LARGE (*)    Ketones, ur 15 (*)    Protein, ur >300 (*)    Leukocytes, UA SMALL (*)    All other components within normal limits  URINE MICROSCOPIC-ADD ON - Abnormal; Notable for the following:    Bacteria, UA MANY (*)    All other components within normal limits  BASIC METABOLIC PANEL - Abnormal; Notable for the following:    Glucose, Bld 105 (*)    Calcium 8.7 (*)    All other components within normal limits  CBC WITH DIFFERENTIAL/PLATELET    Imaging Review No results found.   EKG Interpretation None      MDM   Final diagnoses:  Hematuria  UTI (lower urinary tract infection)   Patient presents with new-onset hematuria, discussed differential diagnosis and plan for follow-up with primary Dr./urology depending on how he does the next 3-4 days. Plan for screening creatinine and hemoglobin. Patient has no pain, no indication for emergent imaging at this time. No fever well-appearing.  UTI, male, fup urology.  Well appearing in ED. No pain in ED.    Results and differential diagnosis were discussed with the patient/parent/guardian. Close follow up outpatient was discussed, comfortable with the plan.   Medications - No data to display  Filed Vitals:   08/19/14 1042  BP: 138/81  Pulse: 68  Temp: 98.2 F (36.8 C)  TempSrc: Oral  Resp: 20  Height: 5' 10"  (1.778 m)  Weight: 323 lb (146.512 kg)  SpO2: 97%    Final diagnoses:  Hematuria  UTI (lower urinary tract infection)       Elnora Morrison, MD 08/19/14 1219

## 2014-08-19 NOTE — Discharge Instructions (Signed)
If you were given medicines take as directed.  If you are on coumadin or contraceptives realize their levels and effectiveness is altered by many different medicines.  If you have any reaction (rash, tongues swelling, other) to the medicines stop taking and see a physician.   Please follow up as directed and return to the ER or see a physician for new or worsening symptoms.  Thank you. Filed Vitals:   08/19/14 1042  BP: 138/81  Pulse: 68  Temp: 98.2 F (36.8 C)  TempSrc: Oral  Resp: 20  Height: 5\' 10"  (1.778 m)  Weight: 323 lb (146.512 kg)  SpO2: 97%    Hematuria Hematuria is blood in your urine. It can be caused by a bladder infection, kidney infection, prostate infection, kidney stone, or cancer of your urinary tract. Infections can usually be treated with medicine, and a kidney stone usually will pass through your urine. If neither of these is the cause of your hematuria, further workup to find out the reason may be needed. It is very important that you tell your health care provider about any blood you see in your urine, even if the blood stops without treatment or happens without causing pain. Blood in your urine that happens and then stops and then happens again can be a symptom of a very serious condition. Also, pain is not a symptom in the initial stages of many urinary cancers. HOME CARE INSTRUCTIONS   Drink lots of fluid, 3-4 quarts a day. If you have been diagnosed with an infection, cranberry juice is especially recommended, in addition to large amounts of water.  Avoid caffeine, tea, and carbonated beverages because they tend to irritate the bladder.  Avoid alcohol because it may irritate the prostate.  Take all medicines as directed by your health care provider.  If you were prescribed an antibiotic medicine, finish it all even if you start to feel better.  If you have been diagnosed with a kidney stone, follow your health care provider's instructions regarding straining  your urine to catch the stone.  Empty your bladder often. Avoid holding urine for long periods of time.  After a bowel movement, women should cleanse front to back. Use each tissue only once.  Empty your bladder before and after sexual intercourse if you are a male. SEEK MEDICAL CARE IF:  You develop back pain.  You have a fever.  You have a feeling of sickness in your stomach (nausea) or vomiting.  Your symptoms are not better in 3 days. Return sooner if you are getting worse. SEEK IMMEDIATE MEDICAL CARE IF:   You develop severe vomiting and are unable to keep the medicine down.  You develop severe back or abdominal pain despite taking your medicines.  You begin passing a large amount of blood or clots in your urine.  You feel extremely weak or faint, or you pass out. MAKE SURE YOU:   Understand these instructions.  Will watch your condition.  Will get help right away if you are not doing well or get worse. Document Released: 04/05/2005 Document Revised: 08/20/2013 Document Reviewed: 12/04/2012 [facility] Patient Information 2015 [place], [place]. This information is not intended to replace advice given to you by your health care provider. Make sure you discuss any questions you have with your health care provider.

## 2014-08-19 NOTE — ED Notes (Signed)
Reports hematuria that started this morning. Grossly blood urine in cup. Reports pain with urination. Denies abdominal pain, nausea and vomiting.

## 2014-09-09 ENCOUNTER — Other Ambulatory Visit (HOSPITAL_COMMUNITY): Payer: Self-pay | Admitting: Urology

## 2014-09-09 DIAGNOSIS — N2889 Other specified disorders of kidney and ureter: Secondary | ICD-10-CM

## 2014-09-10 ENCOUNTER — Other Ambulatory Visit: Payer: Self-pay | Admitting: Urology

## 2014-09-18 ENCOUNTER — Encounter (HOSPITAL_BASED_OUTPATIENT_CLINIC_OR_DEPARTMENT_OTHER): Payer: Self-pay | Admitting: *Deleted

## 2014-09-18 NOTE — Progress Notes (Signed)
NPO AFTER MN.  ARRIVE AT 0800. NEEDS HG.

## 2014-09-23 ENCOUNTER — Ambulatory Visit (HOSPITAL_COMMUNITY)
Admission: RE | Admit: 2014-09-23 | Discharge: 2014-09-23 | Disposition: A | Discharge status: Home / Self Care | Payer: BLUE CROSS/BLUE SHIELD | Source: Ambulatory Visit | Attending: Urology | Admitting: Urology

## 2014-09-23 DIAGNOSIS — N2889 Other specified disorders of kidney and ureter: Secondary | ICD-10-CM

## 2014-09-23 DIAGNOSIS — N281 Cyst of kidney, acquired: Secondary | ICD-10-CM | POA: Insufficient documentation

## 2014-09-23 DIAGNOSIS — R319 Hematuria, unspecified: Secondary | ICD-10-CM | POA: Diagnosis present

## 2014-09-23 MED ORDER — GADOBENATE DIMEGLUMINE 529 MG/ML IV SOLN
20.0000 mL | Freq: Once | INTRAVENOUS | Status: AC | PRN
  Administered 2014-09-23: 20 mL via INTRAVENOUS

## 2014-09-25 ENCOUNTER — Encounter (HOSPITAL_BASED_OUTPATIENT_CLINIC_OR_DEPARTMENT_OTHER)
Admission: RE | Disposition: A | Discharge status: Home / Self Care | Payer: Self-pay | Source: Ambulatory Visit | Attending: Urology

## 2014-09-25 ENCOUNTER — Encounter (HOSPITAL_BASED_OUTPATIENT_CLINIC_OR_DEPARTMENT_OTHER): Payer: Self-pay

## 2014-09-25 ENCOUNTER — Ambulatory Visit (HOSPITAL_BASED_OUTPATIENT_CLINIC_OR_DEPARTMENT_OTHER)
Admission: RE | Admit: 2014-09-25 | Discharge: 2014-09-25 | Disposition: A | Discharge status: Home / Self Care | Payer: BLUE CROSS/BLUE SHIELD | Source: Ambulatory Visit | Attending: Urology | Admitting: Urology

## 2014-09-25 ENCOUNTER — Ambulatory Visit (HOSPITAL_BASED_OUTPATIENT_CLINIC_OR_DEPARTMENT_OTHER): Payer: BLUE CROSS/BLUE SHIELD | Admitting: Anesthesiology

## 2014-09-25 DIAGNOSIS — F17210 Nicotine dependence, cigarettes, uncomplicated: Secondary | ICD-10-CM | POA: Insufficient documentation

## 2014-09-25 DIAGNOSIS — R310 Gross hematuria: Secondary | ICD-10-CM | POA: Insufficient documentation

## 2014-09-25 DIAGNOSIS — N281 Cyst of kidney, acquired: Secondary | ICD-10-CM | POA: Insufficient documentation

## 2014-09-25 DIAGNOSIS — C674 Malignant neoplasm of posterior wall of bladder: Secondary | ICD-10-CM | POA: Diagnosis not present

## 2014-09-25 DIAGNOSIS — N3289 Other specified disorders of bladder: Secondary | ICD-10-CM

## 2014-09-25 DIAGNOSIS — I10 Essential (primary) hypertension: Secondary | ICD-10-CM | POA: Diagnosis not present

## 2014-09-25 DIAGNOSIS — Z6841 Body Mass Index (BMI) 40.0 and over, adult: Secondary | ICD-10-CM | POA: Diagnosis not present

## 2014-09-25 HISTORY — PX: TRANSURETHRAL RESECTION OF BLADDER TUMOR: SHX2575

## 2014-09-25 HISTORY — DX: Gross hematuria: R31.0

## 2014-09-25 HISTORY — DX: Other specified disorders of bladder: N32.89

## 2014-09-25 HISTORY — PX: CYSTOSCOPY WITH BIOPSY: SHX5122

## 2014-09-25 LAB — POCT HEMOGLOBIN-HEMACUE: HEMOGLOBIN: 15.3 g/dL (ref 13.0–17.0)

## 2014-09-25 SURGERY — CYSTOSCOPY, WITH BIOPSY
Anesthesia: General | Site: Bladder

## 2014-09-25 MED ORDER — LIDOCAINE HCL (CARDIAC) 20 MG/ML IV SOLN
INTRAVENOUS | Status: DC | PRN
  Administered 2014-09-25: 100 mg via INTRAVENOUS

## 2014-09-25 MED ORDER — LACTATED RINGERS IV SOLN
INTRAVENOUS | Status: DC
  Administered 2014-09-25 (×3): via INTRAVENOUS
  Filled 2014-09-25: qty 1000

## 2014-09-25 MED ORDER — CEFAZOLIN SODIUM-DEXTROSE 2-3 GM-% IV SOLR
2.0000 g | INTRAVENOUS | Status: AC
  Administered 2014-09-25: 2 g via INTRAVENOUS
  Filled 2014-09-25: qty 50

## 2014-09-25 MED ORDER — LACTATED RINGERS IV SOLN
INTRAVENOUS | Status: DC
  Filled 2014-09-25: qty 1000

## 2014-09-25 MED ORDER — STERILE WATER FOR IRRIGATION IR SOLN
Status: DC | PRN
  Administered 2014-09-25: 500 mL
  Administered 2014-09-25: 3000 mL

## 2014-09-25 MED ORDER — FENTANYL CITRATE (PF) 100 MCG/2ML IJ SOLN
INTRAMUSCULAR | Status: DC | PRN
  Administered 2014-09-25 (×3): 25 ug via INTRAVENOUS
  Administered 2014-09-25: 50 ug via INTRAVENOUS
  Administered 2014-09-25 (×7): 25 ug via INTRAVENOUS

## 2014-09-25 MED ORDER — METOCLOPRAMIDE HCL 5 MG/ML IJ SOLN
INTRAMUSCULAR | Status: DC | PRN
  Administered 2014-09-25: 10 mg via INTRAVENOUS

## 2014-09-25 MED ORDER — ACETAMINOPHEN 10 MG/ML IV SOLN
INTRAVENOUS | Status: DC | PRN
  Administered 2014-09-25: 1000 mg via INTRAVENOUS

## 2014-09-25 MED ORDER — MIDAZOLAM HCL 2 MG/2ML IJ SOLN
INTRAMUSCULAR | Status: AC
  Filled 2014-09-25: qty 2

## 2014-09-25 MED ORDER — OXYCODONE HCL 5 MG PO TABS
ORAL_TABLET | ORAL | Status: AC
  Filled 2014-09-25: qty 1

## 2014-09-25 MED ORDER — OXYCODONE HCL 5 MG PO TABS
5.0000 mg | ORAL_TABLET | Freq: Four times a day (QID) | ORAL | Status: DC | PRN
  Administered 2014-09-25: 5 mg via ORAL
  Filled 2014-09-25: qty 1

## 2014-09-25 MED ORDER — PROPOFOL 10 MG/ML IV BOLUS
INTRAVENOUS | Status: DC | PRN
  Administered 2014-09-25: 250 mg via INTRAVENOUS

## 2014-09-25 MED ORDER — SODIUM CHLORIDE 0.9 % IR SOLN
Status: DC | PRN
  Administered 2014-09-25: 500 mL via INTRAVESICAL
  Administered 2014-09-25 (×2): 3000 mL via INTRAVESICAL

## 2014-09-25 MED ORDER — CEFAZOLIN SODIUM-DEXTROSE 2-3 GM-% IV SOLR
INTRAVENOUS | Status: AC
  Filled 2014-09-25: qty 50

## 2014-09-25 MED ORDER — KETOROLAC TROMETHAMINE 30 MG/ML IJ SOLN
INTRAMUSCULAR | Status: DC | PRN
  Administered 2014-09-25: 30 mg via INTRAVENOUS

## 2014-09-25 MED ORDER — ONDANSETRON HCL 4 MG/2ML IJ SOLN
INTRAMUSCULAR | Status: DC | PRN
  Administered 2014-09-25: 4 mg via INTRAVENOUS

## 2014-09-25 MED ORDER — MEPERIDINE HCL 25 MG/ML IJ SOLN
6.2500 mg | INTRAMUSCULAR | Status: DC | PRN
  Filled 2014-09-25: qty 1

## 2014-09-25 MED ORDER — HYDROCODONE-ACETAMINOPHEN 5-325 MG PO TABS: 1.0000 | ORAL_TABLET | Freq: Four times a day (QID) | ORAL | Status: DC | PRN

## 2014-09-25 MED ORDER — DEXAMETHASONE SODIUM PHOSPHATE 10 MG/ML IJ SOLN
INTRAMUSCULAR | Status: DC | PRN
  Administered 2014-09-25: 10 mg via INTRAVENOUS

## 2014-09-25 MED ORDER — FENTANYL CITRATE (PF) 100 MCG/2ML IJ SOLN
INTRAMUSCULAR | Status: AC
  Filled 2014-09-25: qty 6

## 2014-09-25 MED ORDER — FENTANYL CITRATE (PF) 100 MCG/2ML IJ SOLN
25.0000 ug | INTRAMUSCULAR | Status: DC | PRN
  Filled 2014-09-25: qty 1

## 2014-09-25 MED ORDER — MIDAZOLAM HCL 5 MG/5ML IJ SOLN
INTRAMUSCULAR | Status: DC | PRN
  Administered 2014-09-25 (×2): 1 mg via INTRAVENOUS

## 2014-09-25 MED ORDER — PROMETHAZINE HCL 25 MG/ML IJ SOLN
6.2500 mg | INTRAMUSCULAR | Status: DC | PRN
Start: 2014-09-25 — End: 2014-09-25
  Filled 2014-09-25: qty 1

## 2014-09-25 SURGICAL SUPPLY — 36 items
BAG DRAIN URO-CYSTO SKYTR STRL (DRAIN) ×3 IMPLANT
BAG URINE DRAINAGE (UROLOGICAL SUPPLIES) ×3 IMPLANT
BAG URINE LEG 19OZ MD ST LTX (BAG) ×3 IMPLANT
CANISTER SUCT LVC 12 LTR MEDI- (MISCELLANEOUS) IMPLANT
CATH FOLEY 2WAY SLVR  5CC 20FR (CATHETERS) ×2
CATH FOLEY 2WAY SLVR  5CC 22FR (CATHETERS)
CATH FOLEY 2WAY SLVR 5CC 20FR (CATHETERS) ×1 IMPLANT
CATH FOLEY 2WAY SLVR 5CC 22FR (CATHETERS) IMPLANT
CATH ROBINSON RED A/P 14FR (CATHETERS) IMPLANT
CATH ROBINSON RED A/P 16FR (CATHETERS) IMPLANT
CLOTH BEACON ORANGE TIMEOUT ST (SAFETY) ×3 IMPLANT
ELECT BUTTON BIOP 24F 90D PLAS (MISCELLANEOUS) IMPLANT
ELECT REM PT RETURN 9FT ADLT (ELECTROSURGICAL) ×3
ELECTRODE REM PT RTRN 9FT ADLT (ELECTROSURGICAL) ×1 IMPLANT
EVACUATOR MICROVAS BLADDER (UROLOGICAL SUPPLIES) ×3 IMPLANT
GLOVE BIO SURGEON STRL SZ 6.5 (GLOVE) ×2 IMPLANT
GLOVE BIO SURGEON STRL SZ7.5 (GLOVE) ×3 IMPLANT
GLOVE BIO SURGEONS STRL SZ 6.5 (GLOVE) ×1
GLOVE INDICATOR 6.5 STRL GRN (GLOVE) ×3 IMPLANT
GOWN STRL REUS W/ TWL LRG LVL3 (GOWN DISPOSABLE) ×1 IMPLANT
GOWN STRL REUS W/ TWL XL LVL3 (GOWN DISPOSABLE) ×1 IMPLANT
GOWN STRL REUS W/TWL LRG LVL3 (GOWN DISPOSABLE) ×2
GOWN STRL REUS W/TWL XL LVL3 (GOWN DISPOSABLE) ×2
GOWN XL W/COTTON TOWEL STD (GOWNS) ×3 IMPLANT
HOLDER FOLEY CATH W/STRAP (MISCELLANEOUS) IMPLANT
LOOP CUT BIPOLAR 24F LRG (ELECTROSURGICAL) ×3 IMPLANT
NDL SAFETY ECLIPSE 18X1.5 (NEEDLE) IMPLANT
NEEDLE HYPO 18GX1.5 SHARP (NEEDLE)
NEEDLE HYPO 22GX1.5 SAFETY (NEEDLE) IMPLANT
NS IRRIG 500ML POUR BTL (IV SOLUTION) ×3 IMPLANT
PACK CYSTO (CUSTOM PROCEDURE TRAY) ×3 IMPLANT
PLUG CATH AND CAP STER (CATHETERS) IMPLANT
SET ASPIRATION TUBING (TUBING) ×3 IMPLANT
SYR 20CC LL (SYRINGE) IMPLANT
SYR BULB IRRIGATION 50ML (SYRINGE) IMPLANT
WATER STERILE IRR 3000ML UROMA (IV SOLUTION) ×3 IMPLANT

## 2014-09-25 NOTE — Anesthesia Procedure Notes (Signed)
Procedure Name: LMA Insertion Date/Time: 09/25/2014 9:43 AM Performed by: Justice Rocher Pre-anesthesia Checklist: Patient identified, Emergency Drugs available, Suction available and Patient being monitored Patient Re-evaluated:Patient Re-evaluated prior to inductionOxygen Delivery Method: Circle System Utilized Preoxygenation: Pre-oxygenation with 100% oxygen Intubation Type: IV induction Ventilation: Mask ventilation without difficulty LMA: LMA inserted LMA Size: 5.0 Number of attempts: 1 Airway Equipment and Method: Bite block Placement Confirmation: positive ETCO2 Tube secured with: Tape Dental Injury: Teeth and Oropharynx as per pre-operative assessment

## 2014-09-25 NOTE — Discharge Instructions (Addendum)
Transurethral Resection of Bladder Tumor (TURBT)   Definition:  Transurethral Resection of the Bladder Tumor is a surgical procedure used to diagnose and remove tumors within the bladder. TURBT is the most common treatment for early stage bladder cancer.  General instructions:     Your recent bladder surgery requires very little post hospital care but some definite precautions.  Despite the fact that no skin incisions were used, the area around the bladder incisions are raw and covered with scabs to promote healing and prevent bleeding. Certain precautions are needed to insure that the scabs are not disturbed over the next 2-4 weeks while the healing proceeds.  Because the raw surface inside your bladder and the irritating effects of urine you may expect frequency of urination and/or urgency (a stronger desire to urinate) and perhaps even getting up at night more often. This will usually resolve or improve slowly over the healing period. You may see some blood in your urine over the first 6 weeks. Do not be alarmed, even if the urine was clear for a while. Get off your feet and drink lots of fluids until clearing occurs. If you start to pass clots or don't improve call us.  Diet:  You may return to your normal diet immediately. Because of the raw surface of your bladder, alcohol, spicy foods, foods high in acid and drinks with caffeine may cause irritation or frequency and should be used in moderation. To keep your urine flowing freely and avoid constipation, drink plenty of fluids during the day (8-10 glasses). Tip: Avoid cranberry juice because it is very acidic.  Activity:  Your physical activity doesn't need to be restricted. However, if you are very active, you may see some blood in the urine. We suggest that you reduce your activity under the circumstances until the bleeding has stopped.  Bowels:  It is important to keep your bowels regular during the postoperative period. Straining  with bowel movements can cause bleeding. A bowel movement every other day is reasonable. Use a mild laxative if needed, such as milk of magnesia 2-3 tablespoons, or 2 Dulcolax tablets. Call if you continue to have problems. If you had been taking narcotics for pain, before, during or after your surgery, you may be constipated. Take a laxative if necessary.    Medication:  You should resume your pre-surgery medications unless told not to. In addition you may be given an antibiotic to prevent or treat infection. Antibiotics are not always necessary. All medication should be taken as prescribed until the bottles are finished unless you are having an unusual reaction to one of the drugs.  Indwelling Urinary Catheter Care You have been given a flexible tube (catheter) used to drain the bladder. Catheters are often used when a person has difficulty urinating due to blockage, bleeding, infection, or inability to control bladder or bowel movements (incontinence). A catheter requires daily care to prevent infection and blockage. HOME CARE INSTRUCTIONS  Do the following to reduce the risk of infection. Antibiotic medicines cannot prevent infections. Limit the number of bacteria entering your bladder  Wash your hands for 2 minutes with soapy water before and after handling the catheter.  Wash your bottom and the entire catheter twice daily, as well as after each bowel movement. Wash the tip of the penis or just above the vaginal opening with soap and warm water, rinse, and then wash the rectal area. Always wash from front to back.  When changing from the leg bag to overnight bag  or from the overnight bag to leg bag, thoroughly clean the end of the catheter where it connects to the tubing with an alcohol wipe.  Clean the leg bag and overnight bag daily after use. Replace your drainage bags weekly.  Always keep the tubing and bag below the level of your bladder. This allows your urine to drain properly.  Lifting the bag or tubing above the level of your bladder will cause dirty urine to flow back into your bladder. If you must briefly lift the bag higher than your bladder, pinch the catheter or tubing to prevent backflow.  Drink enough water and fluids to keep your urine clear or pale yellow, or as directed by your caregiver. This will flush bacteria out of the bladder. Protect tissues from injury  Attach the catheter to your leg so there is no tension on the catheter. Use adhesive tape or a leg strap. If you are using adhesive tape, remove any sticky residue left behind by the previous tape you used.  Place your leg bag on your lower leg. Fasten the straps securely and comfortably.  Do not remove the catheter yourself unless you have been instructed how to do so. Keep the urinary pathway open  Check throughout the day to be sure your catheter is working and urine is draining freely. Make sure the tubing does not become kinked.  Do not let the drainage bag overfill. SEEK IMMEDIATE MEDICAL CARE IF:   The catheter becomes blocked. Urine is not draining.  Urine is leaking.  You have any pain.  You have a fever. Document Released: 04/05/2005 Document Revised: 03/22/2012 Document Reviewed: 09/04/2009 [facility] Patient Information 2015 [place], [place]. This information is not intended to replace advice given to you by your health care provider. Make sure you discuss any questions you have with your health care provider.   Post Anesthesia Home Care Instructions  Activity: Get plenty of rest for the remainder of the day. A responsible adult should stay with you for 24 hours following the procedure.  For the next 24 hours, DO NOT: -Drive a car -Paediatric nurse -Drink alcoholic beverages -Take any medication unless instructed by your physician -Make any legal decisions or sign important papers.  Meals: Start with liquid foods such as gelatin or soup. Progress to regular foods as  tolerated. Avoid greasy, spicy, heavy foods. If nausea and/or vomiting occur, drink only clear liquids until the nausea and/or vomiting subsides. Call your physician if vomiting continues.  Special Instructions/Symptoms: Your throat may feel dry or sore from the anesthesia or the breathing tube placed in your throat during surgery. If this causes discomfort, gargle with warm salt water. The discomfort should disappear within 24 hours.  If you had a scopolamine patch placed behind your ear for the management of post- operative nausea and/or vomiting:  1. The medication in the patch is effective for 72 hours, after which it should be removed.  Wrap patch in a tissue and discard in the trash. Wash hands thoroughly with soap and water. 2. You may remove the patch earlier than 72 hours if you experience unpleasant side effects which may include dry mouth, dizziness or visual disturbances. 3. Avoid touching the patch. Wash your hands with soap and water after contact with the patch.

## 2014-09-25 NOTE — Interval H&P Note (Signed)
History and Physical Interval Note:  09/25/2014 9:35 AM  Jesus Soto  has presented today for surgery, with the diagnosis of GROSS HEMATURIA, BLADDER MASS  The various methods of treatment have been discussed with the patient and family. After consideration of risks, benefits and other options for treatment, the patient has consented to  Procedure(s): CYSTOSCOPY WITH BIOPSY (N/A) TRANSURETHRAL RESECTION OF BLADDER TUMOR (TURBT) (N/A) as a surgical intervention .  The patient's history has been reviewed, patient examined, no change in status, stable for surgery.  I have reviewed the patient's chart and labs.  Questions were answered to the patient's satisfaction.     Eleasha Cataldo S

## 2014-09-25 NOTE — Anesthesia Postprocedure Evaluation (Signed)
  Anesthesia Post-op Note  Patient: Jesus Soto  Procedure(s) Performed: Procedure(s) (LRB): CYSTOSCOPY WITH BIOPSY (N/A) TRANSURETHRAL RESECTION OF BLADDER TUMOR (TURBT) (N/A)  Patient Location: PACU  Anesthesia Type: General  Level of Consciousness: awake and alert   Airway and Oxygen Therapy: Patient Spontanous Breathing  Post-op Pain: mild  Post-op Assessment: Post-op Vital signs reviewed, Patient's Cardiovascular Status Stable, Respiratory Function Stable, Patent Airway and No signs of Nausea or vomiting  Last Vitals:  Filed Vitals:   09/25/14 1115  BP: 111/66  Pulse: 58  Temp:   Resp: 11    Post-op Vital Signs: stable   Complications: No apparent anesthesia complications

## 2014-09-25 NOTE — Op Note (Signed)
Preoperative diagnosis: Gross hematuria, bladder mass Postoperative diagnosis: Same  Procedure: Cystoscopy, TURBT   Surgeon: Bernestine Amass M.D.  Anesthesia: Gen.  Indications: Jesus Soto is 44 years of age and presented recently with gross hematuria. He does have a tobacco use history. CT imaging revealed multiple bilateral renal cysts. The majority were benign in appearance. There was one 3 cm lesion in the lower pole of his right kidney that showed questionable enhancement. Social MRI revealed this to have a benign appearance. Cystoscopy however revealed marked urothelium and thickening of the posterior bladder wall. This was concerning for a malignant process. There was not a discrete bladder tumor per se but appearance consistent with infiltrative process. Urine cytology was indeed positive for malignancy. The patient presents now for endoscopic assessment with biopsy and/or resection of this.     Technique and findings: Patient was brought the operating room where he had successful induction of general anesthesia. He was placed in lithotomy position prepped and draped in usual manner. He received. Operative anabiotic some placement of PAS compression boots. Appropriate surgical timeout was performed. Cystoscopically the patient had a unremarkable anterior prostatic urethra. The trigone and ureteral orifices were unremarkable. On the posterior wall of his bladder more on the right side than left was a markedly abnormal appearing area of mucosal thickening with erythema. This area in total was 6-8 cm. 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 Proximally half of this area was resected with a loop. This was done with gyrus instrumentation and saline irrigation. It certainly appeared that muscle was obtained in the specimen. Approximately 6-8 chips of bladder wall and tumor were sent for permanent pathologic analysis. There was no evidence of bladder  perforation. We felt that given probable high-grade muscle invasive disease of mitomycin instillation was given a be of little to no benefit. Hemostasis was excellent. A 20 French Foley catheter was placed with clear urine. The patient was brought to PACU in stable condition.

## 2014-09-25 NOTE — Anesthesia Preprocedure Evaluation (Addendum)
Anesthesia Evaluation  Patient identified by MRN, date of birth, ID band Patient awake    Reviewed: Allergy & Precautions, NPO status , Patient's Chart, lab work & pertinent test results  Airway Mallampati: III  TM Distance: >3 FB Neck ROM: Full    Dental no notable dental hx.    Pulmonary Current Smoker,  breath sounds clear to auscultation  Pulmonary exam normal       Cardiovascular hypertension, Normal cardiovascular examRhythm:Regular Rate:Normal     Neuro/Psych negative neurological ROS  negative psych ROS   GI/Hepatic negative GI ROS, Neg liver ROS,   Endo/Other  Morbid obesity  Renal/GU negative Renal ROS  negative genitourinary   Musculoskeletal negative musculoskeletal ROS (+)   Abdominal   Peds negative pediatric ROS (+)  Hematology negative hematology ROS (+)   Anesthesia Other Findings   Reproductive/Obstetrics negative OB ROS                            Anesthesia Physical Anesthesia Plan  ASA: III  Anesthesia Plan: General   Post-op Pain Management:    Induction: Intravenous  Airway Management Planned: LMA  Additional Equipment:   Intra-op Plan:   Post-operative Plan: Extubation in OR  Informed Consent: I have reviewed the patients History and Physical, chart, labs and discussed the procedure including the risks, benefits and alternatives for the proposed anesthesia with the patient or authorized representative who has indicated his/her understanding and acceptance.   Dental advisory given  Plan Discussed with: CRNA  Anesthesia Plan Comments:        Anesthesia Quick Evaluation

## 2014-09-25 NOTE — H&P (Signed)
Reason For Visit Merry Proud returns today to go over the results of his CT imaging and to undergo flexible cystoscopy to his evaluation for gross hematuria. He had approximately 3 days of gross hematuria. This generally was total painless hematuria with passage of some clots. No systemic complaints or other voiding symptoms. He never had any abdominal or flank discomfort. He has had no further episodes of gross hematuria for the last 2 weeks. Urinalysis today does however show some ongoing microhematuria. He has a current smoker generally smoking approximately 2 packs of cigarettes a week. CT imaging revealed bilateral renal sessile of various sizes. These appear to be benign with the exception of one 2.6 cm lesion in the lower pole of the right kidney which was felt out mild questionable enhancement. Radiology recommended consideration for a MRI of the abdomen.   History of Present Illness          44 YO male patient of Dr. Cy Blamer seen for an ER f/u.     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 They have had unprotected intercourse for 3+ years without establishing a pregnancy. Just tells me he also is married previously and in relationship for 12 years without establishing a pregnancy despite lack of birth control. He informed [place] that he had a comprehensive semen analysis in 2013. He recalls being told that the counts were low but does not know any specifics. We do not have those results and his wife will need to sign a release before we can get those results. He has no voiding complaints. He has had no erectile dysfunction. Ejaculation apparently is normal. He has no prior urologic history. He has no social or occupational risk factors. He has no pertinent childhood history.     We never obtained any outside records. The patient did do a repeat comprehensive semen analysis. Volume was 3.6 mL. Very rare sperm were noted. One motile sperm was appreciated. Too few sperm were noted to  allow for sperm density, motility or morphology be done.     May 2016 Interval Hx:   Presented to ER 08/19/14 for painless gross hematuria. Initial hematuria was bright red blood now dark tea/rust colored. Denies previous hx of gross hematuria. Does smoke 1/2 PPD. No previous hx of nephrolithiasis. Strong family hx of PCa in several uncles and both grandfathers. No hx of renal/bladder cancer in family.   Past Medical History Problems  1. History of hypercholesterolemia (Z86.39)  Surgical History Problems  1. History of Tonsillectomy  Current Meds 1. No Reported Medications Recorded  Allergies Medication  1. No Known Drug Allergies  Family History Problems  1. Family history of Blood Disorders : Father 2. Family history of Cancer : Father 3. Family history of Cancer : Mother 4. Family history of Death In The Family Mother : Mother   74yrs, cancer 5. Family history of Family Health Status - Father's Age : Father   81yrs 78. Family history of Family Health Status Number Of Children   No children 7. Family history of Prostate Cancer : Maternal Grandfather 8. Family history of Prostate Cancer : Maternal Uncle 9. Family history of Prostate Cancer : Paternal Uncle  Social History Problems  1. Denied: History of Alcohol Use 2. Caffeine Use   2-3 qd 3. Current every day smoker (F17.200) 4. Marital History - Currently Married 5. Occupation:   IT 6. Tobacco use (Z72.0)   1/4ppd for 52yrs  Review of Systems Genitourinary, constitutional, skin, eye,  otolaryngeal, hematologic/lymphatic, cardiovascular, pulmonary, endocrine, musculoskeletal, gastrointestinal, neurological and psychiatric system(s) were reviewed and pertinent findings if present are noted and are otherwise negative.  Genitourinary: hematuria, but no urinary frequency and no dysuria.  Gastrointestinal: no flank pain and no abdominal pain.    Vitals Vital Signs [Data Includes: Last 1 Day]  Recorded:  [id number] 08:41AM  Blood Pressure: 148 / 94 Temperature: 97.9 F Heart Rate: 67  Physical Exam Constitutional: Well nourished and well developed . No acute distress.  Cardiovascular: Heart rate and rhythm are normal . No peripheral edema.  Abdomen: The abdomen is soft and nontender. No masses are palpated. No CVA tenderness. No hernias are palpable. No hepatosplenomegaly noted.  Genitourinary: Examination of the penis demonstrates no discharge, no masses, no lesions and a normal meatus. The scrotum is without lesions. The right epididymis is palpably normal and non-tender. The left epididymis is palpably normal and non-tender. The right testis is non-tender and without masses. The left testis is non-tender and without masses.    Results/Data  09 Sep 2014 8:32 AM  UA With REFLEX    COLOR YELLOW     APPEARANCE CLEAR     SPECIFIC GRAVITY 1.025     pH 6.0     GLUCOSE NEG     BILIRUBIN NEG     KETONE NEG     BLOOD MOD     PROTEIN TRACE     UROBILINOGEN 0.2     NITRITE NEG     LEUKOCYTE ESTERASE TRACE     SQUAMOUS EPITHELIAL/HPF RARE     WBC 0-2     CRYSTALS NONE SEEN     CASTS NONE SEEN     RBC 7-10     BACTERIA RARE     Procedure  Procedure: Cystoscopy   Indication: Hematuria. Bladder Mass.  Informed Consent: Risks, benefits, and potential adverse events were discussed and informed consent was obtained. Specific risks including, but not limited to bleeding, infection, pain, allergic reaction etc. were explained.  Prep: The patient was prepped with betadine.  Anesthesia:. Local anesthesia was administered intraurethrally with 2% lidocaine jelly.  Antibiotic prophylaxis: Ciprofloxacin.  Procedure Note:  Urethral meatus:. No abnormalities.  Anterior urethra: No abnormalities.  Prostatic urethra: No abnormalities.  Bladder: Visulization was clear. Abnormal mucosa noted at bladder base. The entire bladder base appeared to be thickened with increased erythema and definite edema.  Assistant with a inflammatory process or potentially an infiltrative type of malignant process. Absolute discrete tumor was not appreciated. Visually concerning for again infiltrative malignancy of either prostate or transitional cell origin. A    Assessment Assessed  1. Gross hematuria (R31.0) 2. Renal cyst, acquired (N28.1) 3. Renal mass (N28.89) 4. Bladder mass (N32.89)  Plan Gross hematuria  1. Follow-up Schedule Surgery Office  Follow-up  Status: Complete  Done: [id number] 2. Cysto; Status:Complete;   Done: [id number] Prostate cancer screening  3. Cysto; Status:Canceled - Date of Service;   Discussion/Summary Painless gross hematuria Now with ongoing microhematuria. No significant voiding or systemic complaints. Cystoscopy reveals clear evidence of abnormal mucosa with thickening and erythema at the bladder base close to the bladder neck. This area needs to be further investigated with biopsy and/or resection. I had a difficult time appreciating the ureteral orifices. Urine will be sent for cytology today. There is also a question of a right renal mass. This has not been completely characterized and abdominal MRI with and without contrast will be needed to assess that further. We spent approximately 25-30 minutes  of face-to-face time discussing these issues today.   Signatures Electronically signed by : Rana Snare, M.D.; Sep 09 2014  9:35AM EST

## 2014-09-25 NOTE — Transfer of Care (Signed)
Immediate Anesthesia Transfer of Care Note  Patient: Jesus Soto  Procedure(s) Performed: Procedure(s) (LRB): CYSTOSCOPY WITH BIOPSY (N/A) TRANSURETHRAL RESECTION OF BLADDER TUMOR (TURBT) (N/A)  Patient Location: PACU  Anesthesia Type: General  Level of Consciousness: awake, sedated, patient cooperative and responds to stimulation  Airway & Oxygen Therapy: Patient Spontanous Breathing and Patient connected to face mask oxygen  Post-op Assessment: Report given to PACU RN, Post -op Vital signs reviewed and stable and Patient moving all extremities  Post vital signs: Reviewed and stable  Complications: No apparent anesthesia complications

## 2014-09-26 ENCOUNTER — Encounter (HOSPITAL_BASED_OUTPATIENT_CLINIC_OR_DEPARTMENT_OTHER): Payer: Self-pay | Admitting: Urology

## 2014-10-07 ENCOUNTER — Ambulatory Visit (INDEPENDENT_AMBULATORY_CARE_PROVIDER_SITE_OTHER): Payer: BLUE CROSS/BLUE SHIELD | Admitting: Physician Assistant

## 2014-10-07 ENCOUNTER — Telehealth: Payer: Self-pay | Admitting: *Deleted

## 2014-10-07 ENCOUNTER — Encounter: Payer: Self-pay | Admitting: Physician Assistant

## 2014-10-07 VITALS — BP 136/78 | HR 73 | Temp 98.0°F | Resp 16 | Ht 70.0 in | Wt 323.5 lb

## 2014-10-07 DIAGNOSIS — N3289 Other specified disorders of bladder: Secondary | ICD-10-CM

## 2014-10-07 DIAGNOSIS — R609 Edema, unspecified: Secondary | ICD-10-CM | POA: Insufficient documentation

## 2014-10-07 LAB — BASIC METABOLIC PANEL
BUN: 16 mg/dL (ref 6–23)
CO2: 26 mEq/L (ref 19–32)
CREATININE: 1.11 mg/dL (ref 0.40–1.50)
Calcium: 9.3 mg/dL (ref 8.4–10.5)
Chloride: 105 mEq/L (ref 96–112)
GFR: 75.96 mL/min (ref >60.00)
Glucose, Bld: 102 mg/dL — ABNORMAL HIGH (ref 70–99)
Potassium: 3.8 mEq/L (ref 3.5–5.1)
Sodium: 138 mEq/L (ref 135–145)

## 2014-10-07 NOTE — Assessment & Plan Note (Signed)
Patient requesting second opinion with [facility].  Will obtain records from Dr. Risa Grill to send.  Referral placed.

## 2014-10-07 NOTE — Progress Notes (Signed)
Patient presents to clinic today to discuss referral to oncology.  Patient recently underwent diagnostic cystoscopy with biopsy of suspicious area.  Endorses biopsy revealed bladder carcinoma. Has upcoming appointment with another provider in that practice to discuss treatment options. Patient wishes to see another provider for second opinion regarding his diagnosis.  Past Medical History  Diagnosis Date  . Gross hematuria   . Bladder mass     No current outpatient prescriptions on file prior to visit.   No current facility-administered medications on file prior to visit.    No Known Allergies  Family History  Problem Relation Age of Onset  . Cancer Mother     ovarian  . Multiple myeloma Father   . Cancer Maternal Uncle     PROSTATE  . Cancer Paternal Uncle     PROSTATE  . Parkinson's disease Paternal Uncle   . Heart disease Paternal Grandmother     History   Social History  . Marital Status: Married    Spouse Name: N/A  . Number of Children: N/A  . Years of Education: N/A   Social History Main Topics  . Smoking status: Former Smoker -- 0.50 packs/day for 25 years    Types: Cigarettes    Quit date: 10/04/2014  . Smokeless tobacco: Never Used     Comment: Recently Quit  . Alcohol Use: 0.0 oz/week    0 Standard drinks or equivalent per week     Comment: rare  . Drug Use: No  . Sexual Activity: Not on file     Comment: women -- wife   Other Topics Concern  . None   Social History Narrative   Review of Systems - See HPI.  All other ROS are negative.  BP 136/78 mmHg  Pulse 73  Temp(Src) 98 F (36.7 C) (Oral)  Resp 16  Ht 5' 10"  (1.778 m)  Wt 323 lb 8 oz (146.739 kg)  BMI 46.42 kg/m2  SpO2 99%  Physical Exam  Recent Results (from the past 2160 hour(s))  Urinalysis, Routine w reflex microscopic     Status: Abnormal   Collection Time: 08/19/14 10:55 AM  Result Value Ref Range   Color, Urine RED (A) YELLOW    Comment: BIOCHEMICALS MAY BE AFFECTED BY  COLOR   APPearance TURBID (A) CLEAR   Specific Gravity, Urine 1.021 1.005 - 1.030   pH 6.0 5.0 - 8.0   Glucose, UA NEGATIVE NEGATIVE mg/dL   Hgb urine dipstick LARGE (A) NEGATIVE   Bilirubin Urine NEGATIVE NEGATIVE   Ketones, ur 15 (A) NEGATIVE mg/dL   Protein, ur >300 (A) NEGATIVE mg/dL   Urobilinogen, UA 0.2 0.0 - 1.0 mg/dL   Nitrite NEGATIVE NEGATIVE   Leukocytes, UA SMALL (A) NEGATIVE  Urine microscopic-add on     Status: Abnormal   Collection Time: 08/19/14 10:55 AM  Result Value Ref Range   Squamous Epithelial / LPF RARE RARE   WBC, UA 21-50 <3 WBC/hpf   RBC / HPF TOO NUMEROUS TO COUNT <3 RBC/hpf   Bacteria, UA MANY (A) RARE   Urine-Other URINALYSIS PERFORMED ON SUPERNATANT     Comment: SPECIMEN GROSS BLOOD  CBC with Differential/Platelet     Status: None   Collection Time: 08/19/14 11:10 AM  Result Value Ref Range   WBC 6.3 4.0 - 10.5 K/uL   RBC 5.43 4.22 - 5.81 MIL/uL   Hemoglobin 16.0 13.0 - 17.0 g/dL   HCT 46.0 39.0 - 52.0 %   MCV 84.7 78.0 -  100.0 fL   MCH 29.5 26.0 - 34.0 pg   MCHC 34.8 30.0 - 36.0 g/dL   RDW 12.6 11.5 - 15.5 %   Platelets 157 150 - 400 K/uL   Neutrophils Relative % 66 43 - 77 %   Neutro Abs 4.2 1.7 - 7.7 K/uL   Lymphocytes Relative 26 12 - 46 %   Lymphs Abs 1.7 0.7 - 4.0 K/uL   Monocytes Relative 6 3 - 12 %   Monocytes Absolute 0.4 0.1 - 1.0 K/uL   Eosinophils Relative 2 0 - 5 %   Eosinophils Absolute 0.1 0.0 - 0.7 K/uL   Basophils Relative 0 0 - 1 %   Basophils Absolute 0.0 0.0 - 0.1 K/uL  Basic metabolic panel     Status: Abnormal   Collection Time: 08/19/14 11:40 AM  Result Value Ref Range   Sodium 140 135 - 145 mmol/L   Potassium 4.0 3.5 - 5.1 mmol/L   Chloride 111 101 - 111 mmol/L   CO2 24 22 - 32 mmol/L   Glucose, Bld 105 (H) 70 - 99 mg/dL   BUN 16 6 - 20 mg/dL   Creatinine, Ser 1.00 0.61 - 1.24 mg/dL   Calcium 8.7 (L) 8.9 - 10.3 mg/dL   GFR calc non Af Amer >60 >60 mL/min   GFR calc Af Amer >60 >60 mL/min    Comment:  (NOTE) The eGFR has been calculated using the CKD EPI equation. This calculation has not been validated in all clinical situations. eGFR's persistently <90 mL/min signify possible Chronic Kidney Disease.    Anion gap 5 5 - 15  Hemoglobin-hemacue, POC     Status: None   Collection Time: 09/25/14  8:35 AM  Result Value Ref Range   Hemoglobin 15.3 13.0 - 17.0 g/dL    Assessment/Plan: Peripheral edema Resolved.  No edema noted.  Exam within normal limits.  DASH diet and activity discussed.  Supportive measures reviewed and handout given. Will check BMP.  Follow-up will be based on results and if symptoms recur.  Bladder mass Patient requesting second opinion with [facility].  Will obtain records from Dr. Risa Grill to send.  Referral placed.

## 2014-10-07 NOTE — Telephone Encounter (Signed)
Medical records received via fax from Dr. Risa Grill - [facility]. Forwarded to [employer]. JG//CMA

## 2014-10-07 NOTE — Patient Instructions (Signed)
Please stop by the lab for blood work. I will call you with your results.  Limit salt intake and stay active.  IF you notice mild swelling be sure to elevate your legs while resting. If symptoms recur and are not going away in a timely fashion, please call or come see me.  You will be contacted by Urology at [facility] for second opinion.

## 2014-10-07 NOTE — Progress Notes (Signed)
Pre visit review using our clinic review tool, if applicable. No additional management support is needed unless otherwise documented below in the visit note/SLS  

## 2014-10-07 NOTE — Assessment & Plan Note (Signed)
Resolved.  No edema noted.  Exam within normal limits.  DASH diet and activity discussed.  Supportive measures reviewed and handout given. Will check BMP.  Follow-up will be based on results and if symptoms recur.

## 2014-10-23 ENCOUNTER — Telehealth: Payer: Self-pay | Admitting: Physician Assistant

## 2014-10-23 NOTE — Telephone Encounter (Signed)
Nothing I can do. Pt needs to contact [facility] to have records faxed to [facility], looks like he wants to talk to you about results.

## 2014-10-23 NOTE — Telephone Encounter (Signed)
Informed patient that he needs to call [facility].  Angie, Patient states that he does not need a callback regarding results

## 2014-10-23 NOTE — Telephone Encounter (Signed)
Patient states that he has an appointment on Friday and is requesting these be faxed before then.

## 2014-10-23 NOTE — Telephone Encounter (Addendum)
Pt requesting office to fax over records from [facility] to [facility] phone # [phone] fax# [phone]. Pt states medical release form was filled out at last office visit.  Pt would like to speak with CMA regarding results please call between 12 and 1. Thank you

## 2014-10-23 NOTE — Telephone Encounter (Signed)
error:[id number] ° °

## 2014-10-28 NOTE — Telephone Encounter (Signed)
Called and spoke with the pt and he stated that just wanted to make sure that he had everything he needed for his appt at [facility].  He was able to get what he needed.//AB/CMA

## 2015-12-08 ENCOUNTER — Emergency Department (HOSPITAL_BASED_OUTPATIENT_CLINIC_OR_DEPARTMENT_OTHER): Payer: BLUE CROSS/BLUE SHIELD

## 2015-12-08 ENCOUNTER — Emergency Department (HOSPITAL_BASED_OUTPATIENT_CLINIC_OR_DEPARTMENT_OTHER)
Admission: EM | Admit: 2015-12-08 | Discharge: 2015-12-08 | Disposition: A | Discharge status: Home / Self Care | Payer: BLUE CROSS/BLUE SHIELD | Attending: Emergency Medicine | Admitting: Emergency Medicine

## 2015-12-08 ENCOUNTER — Encounter (HOSPITAL_BASED_OUTPATIENT_CLINIC_OR_DEPARTMENT_OTHER): Payer: Self-pay

## 2015-12-08 DIAGNOSIS — N390 Urinary tract infection, site not specified: Secondary | ICD-10-CM

## 2015-12-08 DIAGNOSIS — Z8551 Personal history of malignant neoplasm of bladder: Secondary | ICD-10-CM | POA: Diagnosis not present

## 2015-12-08 DIAGNOSIS — Z87891 Personal history of nicotine dependence: Secondary | ICD-10-CM | POA: Insufficient documentation

## 2015-12-08 DIAGNOSIS — K5900 Constipation, unspecified: Secondary | ICD-10-CM | POA: Diagnosis present

## 2015-12-08 HISTORY — DX: Malignant (primary) neoplasm, unspecified: C80.1

## 2015-12-08 LAB — URINE MICROSCOPIC-ADD ON

## 2015-12-08 LAB — URINALYSIS, ROUTINE W REFLEX MICROSCOPIC
Bilirubin Urine: NEGATIVE
GLUCOSE, UA: NEGATIVE mg/dL
Ketones, ur: NEGATIVE mg/dL
Nitrite: NEGATIVE
PH: 6 (ref 5.0–8.0)
Protein, ur: 30 mg/dL — AB
SPECIFIC GRAVITY, URINE: 1.014 (ref 1.005–1.030)

## 2015-12-08 LAB — OCCULT BLOOD X 1 CARD TO LAB, STOOL: FECAL OCCULT BLD: NEGATIVE

## 2015-12-08 MED ORDER — SULFAMETHOXAZOLE-TRIMETHOPRIM 800-160 MG PO TABS: 1.0000 | ORAL_TABLET | Freq: Two times a day (BID) | ORAL | 0 refills | Status: AC

## 2015-12-08 MED FILL — SULFAMETHOXAZOLE-TMP DS TAB: 800-160 | 7 days supply | Qty: 14 | Fill #0

## 2015-12-08 NOTE — ED Provider Notes (Signed)
[facility] Provider Note   CSN: [id number] Arrival date & time: 12/08/15  1021     History   Chief Complaint Chief Complaint  Patient presents with  . Constipation    HPI Jesus Soto is a 45 y.o. male.  HPI Complains of feeling constipated for the past 2 months. Worse over the past few days. He denies fever. He is treated himself with MiraLAX without significant relief. Feels a "pressure in his lower abdomen." He reports that he is become incontinent of urine only at night over the past few days. No other associated symptoms. Nothing makes symptoms better or worse. No nausea or vomiting no fever. No appetite change Past Medical History:  Diagnosis Date  . Bladder mass   . Cancer ([place])   . Gross hematuria    Reports last cystoscopy showed no cancer. Patient Active Problem List   Diagnosis Date Noted  . Peripheral edema 10/07/2014  . Bladder mass 09/25/2014  . Essential hypertension, benign 06/06/2013  . Hypertriglyceridemia 06/06/2013    Past Surgical History:  Procedure Laterality Date  . CYSTOSCOPY WITH BIOPSY N/A 09/25/2014   Procedure: CYSTOSCOPY WITH BIOPSY;  Surgeon: Rana Snare, MD;  Location: [facility];  Service: Urology;  Laterality: N/A;  . TONSILLECTOMY AND ADENOIDECTOMY  2006   and Uvulectomy  . TRANSURETHRAL RESECTION OF BLADDER TUMOR N/A 09/25/2014   Procedure: TRANSURETHRAL RESECTION OF BLADDER TUMOR (TURBT);  Surgeon: Rana Snare, MD;  Location: [facility];  Service: Urology;  Laterality: N/A;  . WISDOM TOOTH EXTRACTION  age 34       Home Medications    Prior to Admission medications   Not on File    Family History Family History  Problem Relation Age of Onset  . Cancer Mother     ovarian  . Multiple myeloma Father   . Cancer Maternal Uncle     PROSTATE  . Cancer Paternal Uncle     PROSTATE  . Parkinson's disease Paternal Uncle   . Heart disease Paternal Grandmother     Social  History Social History  Substance Use Topics  . Smoking status: Former Smoker    Packs/day: 0.50    Years: 25.00    Types: Cigarettes    Quit date: 10/04/2014  . Smokeless tobacco: Never Used     Comment: Recently Quit  . Alcohol use 0.0 oz/week     Comment: rare     Allergies   Review of patient's allergies indicates no known allergies.   Review of Systems Review of Systems  Constitutional: Negative.   HENT: Negative.   Respiratory: Negative.   Cardiovascular: Negative.   Gastrointestinal: Positive for constipation.  Genitourinary:       Urinary incontinence at night  Musculoskeletal: Negative.   Skin: Negative.   Neurological: Negative.   Psychiatric/Behavioral: Negative.   All other systems reviewed and are negative.    Physical Exam Updated Vital Signs BP 156/100 (BP Location: Left Arm)   Pulse 71   Temp 98.2 F (36.8 C) (Oral)   Resp 18   Ht 5' 10" (1.778 m)   Wt (!) 321 lb (145.6 kg)   SpO2 98%   BMI 46.06 kg/m   Physical Exam  Constitutional: He appears well-developed and well-nourished. No distress.  HENT:  Head: Normocephalic and atraumatic.  Eyes: Conjunctivae are normal. Pupils are equal, round, and reactive to light.  Neck: Neck supple. No tracheal deviation present. No thyromegaly present.  Cardiovascular: Normal rate and regular  rhythm.   No murmur heard. Pulmonary/Chest: Effort normal and breath sounds normal.  Abdominal: Soft. Bowel sounds are normal. He exhibits no distension. There is no tenderness.  Genitourinary: Rectum normal and penis normal.  Genitourinary Comments: Scrotum normal Rectal normal tone soft brown stool no gross blood  Musculoskeletal: Normal range of motion. He exhibits no edema or tenderness.  Neurological: He is alert. Coordination normal.  Skin: Skin is warm and dry. No rash noted.  Psychiatric: He has a normal mood and affect.  Nursing note and vitals reviewed.    ED Treatments / Results  Labs (all labs  ordered are listed, but only abnormal results are displayed) Labs Reviewed  OCCULT BLOOD X 1 CARD TO LAB, STOOL    EKG  EKG Interpretation None       Radiology No results found.  Procedures Procedures (including critical care time)  Medications Ordered in ED Medications - No data to display   Initial Impression / Assessment and Plan / ED Course  I have reviewed the triage vital signs and the nursing notes.  Pertinent labs & imaging results that were available during my care of the patient were reviewed by me and considered in my medical decision making (see chart for details).  Clinical Course   Results for orders placed or performed during the hospital encounter of 12/08/15  Occult blood card to lab, stool Provider will collect  Result Value Ref Range   Fecal Occult Bld NEGATIVE NEGATIVE  Urinalysis, Routine w reflex microscopic (not at [facility])  Result Value Ref Range   Color, Urine YELLOW YELLOW   APPearance CLOUDY (A) CLEAR   Specific Gravity, Urine 1.014 1.005 - 1.030   pH 6.0 5.0 - 8.0   Glucose, UA NEGATIVE NEGATIVE mg/dL   Hgb urine dipstick LARGE (A) NEGATIVE   Bilirubin Urine NEGATIVE NEGATIVE   Ketones, ur NEGATIVE NEGATIVE mg/dL   Protein, ur 30 (A) NEGATIVE mg/dL   Nitrite NEGATIVE NEGATIVE   Leukocytes, UA LARGE (A) NEGATIVE  Urine microscopic-add on  Result Value Ref Range   Squamous Epithelial / LPF 0-5 (A) NONE SEEN   WBC, UA TOO NUMEROUS TO COUNT 0 - 5 WBC/hpf   RBC / HPF TOO NUMEROUS TO COUNT 0 - 5 RBC/hpf   Bacteria, UA FEW (A) NONE SEEN   Urine-Other MUCOUS PRESENT    Dg Abdomen 1 View  Result Date: 12/08/2015 CLINICAL DATA:  Lower abdominal pain for 5 days, worse with bowel movement. EXAM: ABDOMEN - 1 VIEW COMPARISON:  CT 09/06/2014 FINDINGS: There is a non obstructive bowel gas pattern. No supine evidence of free air. No organomegaly or suspicious calcification.No acute bony abnormality. IMPRESSION: Negative. Electronically Signed   By:  Rolm Baptise M.D.   On: 12/08/2015 11:33   X-ray viewed by me. Plan urine sent for culture. Prescription Bactrim DS. Follow-up with urologist. He is advised that he try Dulcolax suppository or enema if he feels constipated.  Final Clinical Impressions(s) / ED Diagnoses  Diagnosis #1 urinary tract infection #2 constipation Final diagnoses:  None    New Prescriptions New Prescriptions   No medications on file     Orlie Dakin, MD 12/08/15 1348

## 2015-12-08 NOTE — ED Notes (Signed)
MD at bedside discussing results with patient and family. 

## 2015-12-08 NOTE — Discharge Instructions (Signed)
Urine has been sent for culture. We will call you if the antibiotic needs to be changed. Please contact your urologist today to schedule next available office appointment. You can try a Dulcolax suppository or fleets enema as needed for constipation.

## 2015-12-08 NOTE — ED Notes (Signed)
Pt. returned from XR. 

## 2015-12-08 NOTE — ED Notes (Signed)
Pt states that he has been on narcotics for 3 months.  Believes he is constipated due to the medications.  Pt feels pressure and pain when trying to have a BM.  Pt states that he has been taking miralax x 5 days.

## 2015-12-08 NOTE — ED Triage Notes (Signed)
Pt reports having 3 "small bowel movements" today. Sts having pain and pressure. Reports diagnosis of bladder cancer last year and was taking pain medication. Denies blood in stool. Pt reports using Miralax once daily. Denies nausea and vomiting.

## 2015-12-08 NOTE — ED Notes (Signed)
Pt ambulated to XR at this time.

## 2015-12-09 LAB — URINE CULTURE: CULTURE: NO GROWTH

## 2016-01-22 IMAGING — MR MR ABDOMEN WO/W CM
9 of 18 series · 22 of 48 positions shown · IV contrast (multihance)
Comparison: 09/06/2014 CT

CLINICAL DATA: Hematuria with lower pole right renal indeterminate
lesion.

EXAM:
MRI ABDOMEN WITHOUT AND WITH CONTRAST
TECHNIQUE: Multiplanar multisequence MR imaging of the abdomen was performed
both before and after the administration of intravenous contrast.
CONTRAST:  20mL MULTIHANCE GADOBENATE DIMEGLUMINE 529 MG/ML IV SOLN

[Series 3: DWI b500 · axial · 6.0mm · 1.88mm/px · z∈[-123,+150]mm · 4 of 72 slices shown]
[im 1/72]
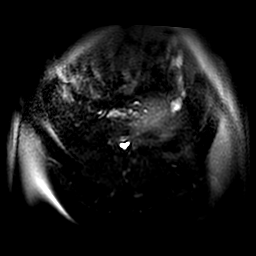
[im 24/72]
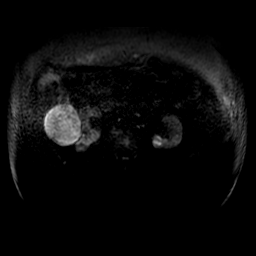
[im 48/72]
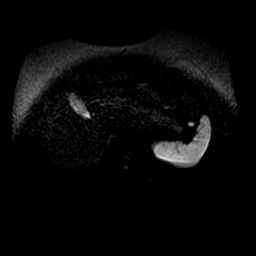
[im 72/72]
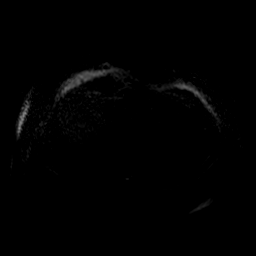

[Series 4: T2 fat-sat · axial · 5.0mm · 0.94mm/px · z∈[-116,+109]mm · 2 of 46 slices shown]
[im 1/46]
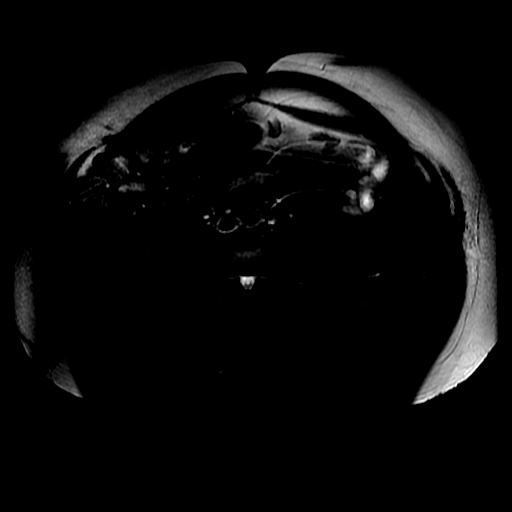
[im 46/46]
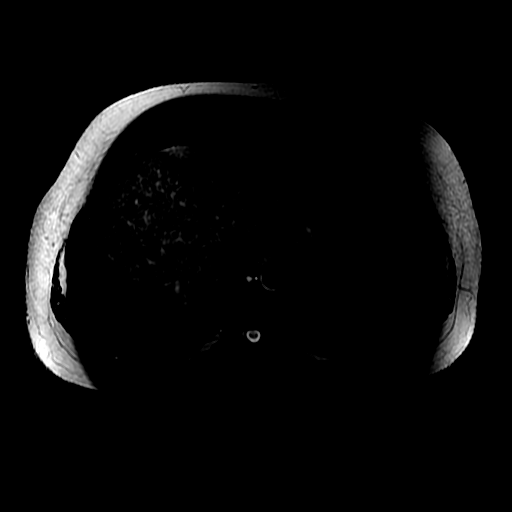

[Series 5: ax dualecho · axial · 5.0mm · 0.94mm/px · z∈[-116,+109]mm · 3 of 92 slices shown]
[im 1/92]
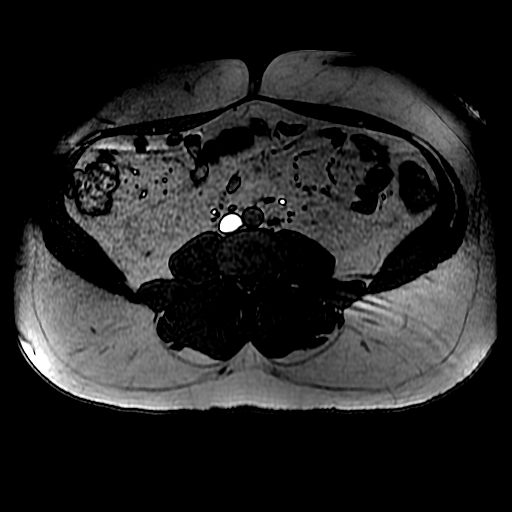
[im 46/92]
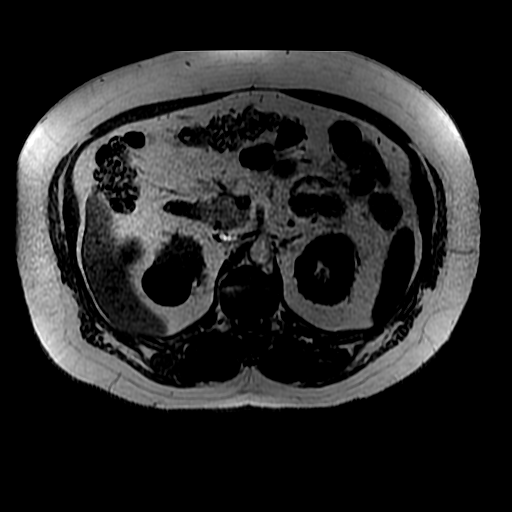
[im 92/92]
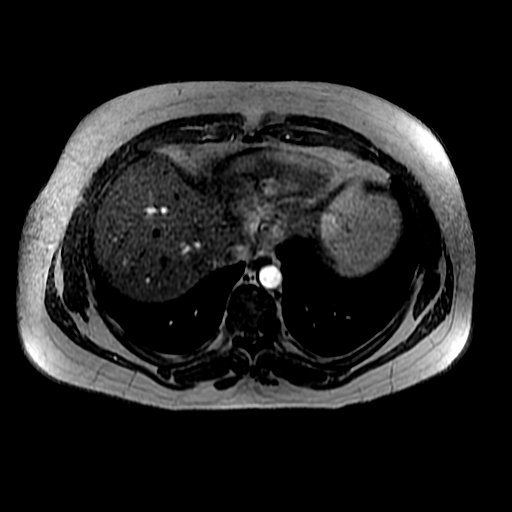

[Series 6: T2 · axial · 5.0mm · 0.94mm/px · z∈[-116,+109]mm · 2 of 46 slices shown (1 of 2)]
[im 1/46]
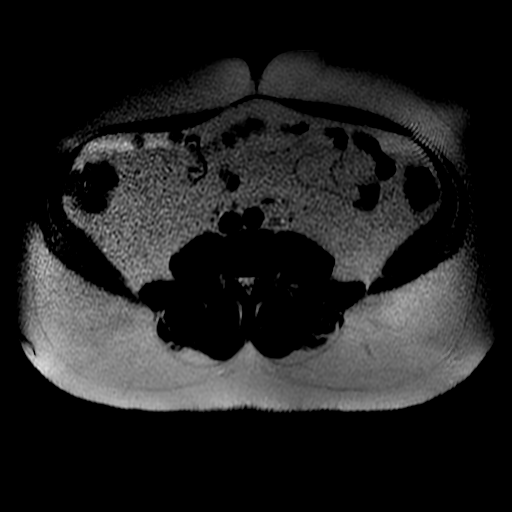
[im 46/46]
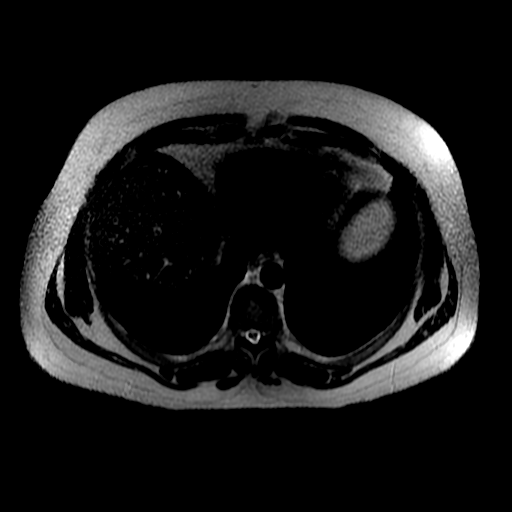

[Series 7: T2 · coronal · 5.0mm · 0.94mm/px · 2 of 46 slices shown (2 of 2)]
[im 1/46]
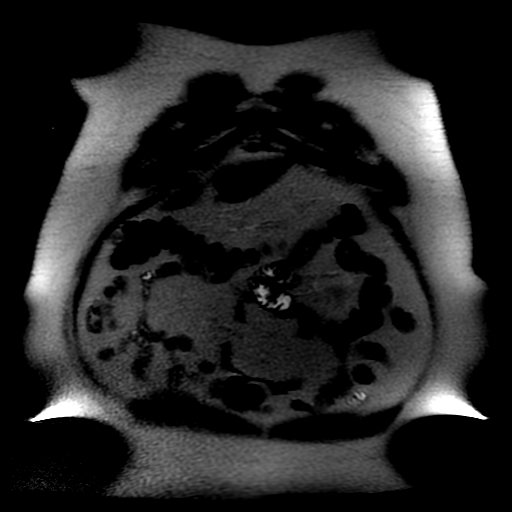
[im 46/46]
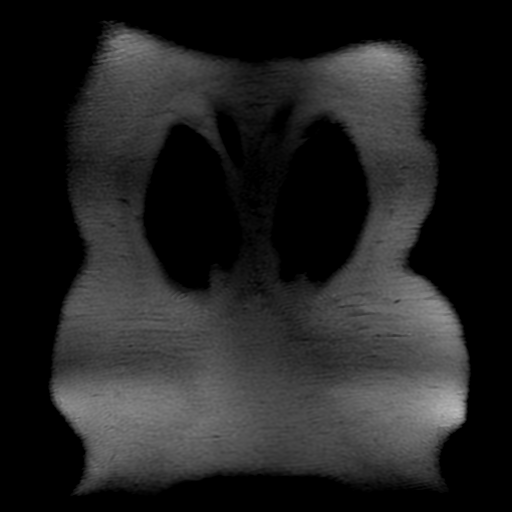

[Series 8: bSSFP · axial · 5.0mm · 0.94mm/px · z∈[-116,+109]mm · 2 of 46 slices shown]
[im 1/46]
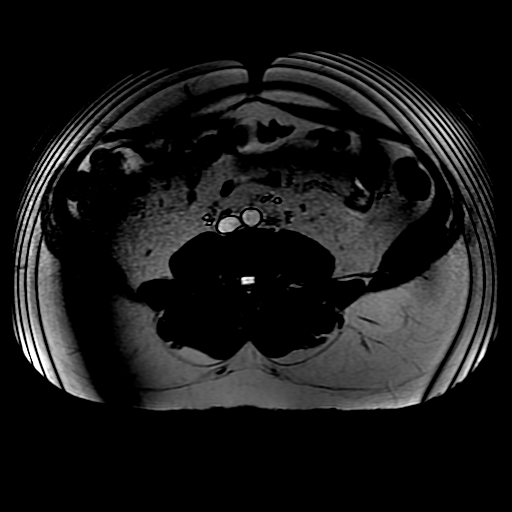
[im 46/46]
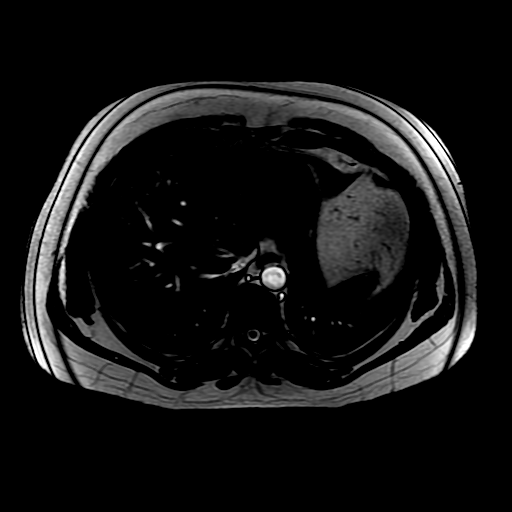

[Series 300: DWI · axial · 6.0mm · 1.88mm/px · 1 of 36 slices shown]
[im 1/36]
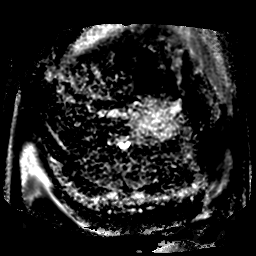

[Series 900: T1 dynamic · axial · 5.0mm · 0.82mm/px · z∈[-119,+98]mm · 3 of 88 slices shown (1 of 2)]
[im 1/88]
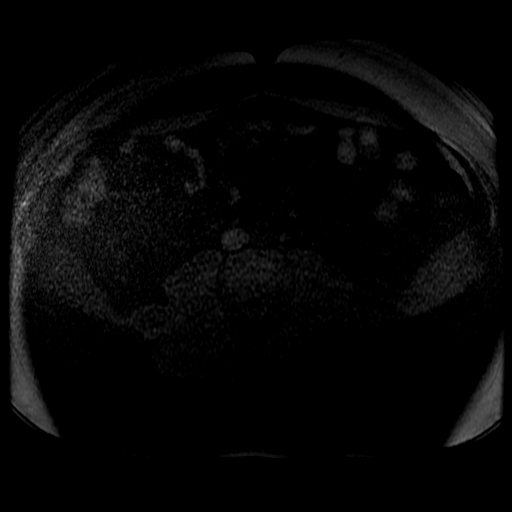
[im 44/88]
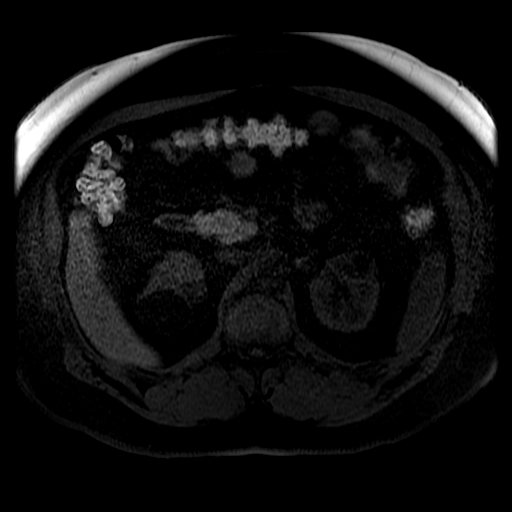
[im 88/88]
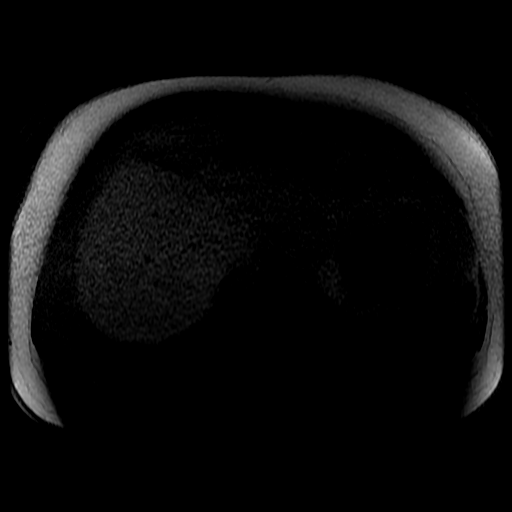

[Series 901: T1 dynamic · axial · 5.0mm · 0.82mm/px · z∈[-119,+98]mm · 3 of 88 slices shown (2 of 2)]
[im 1/88]
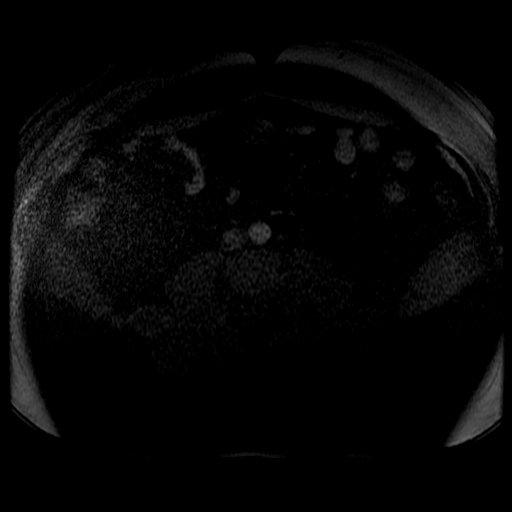
[im 44/88]
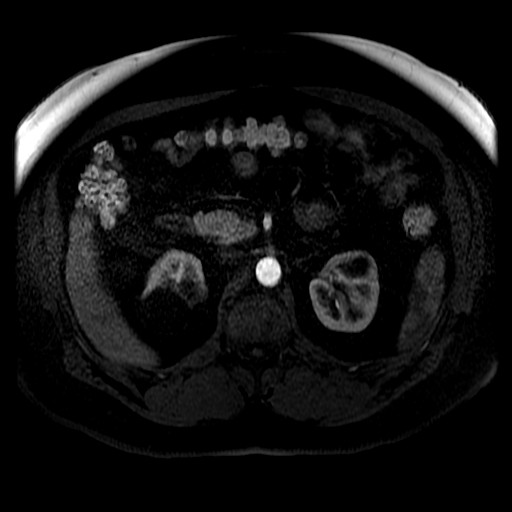
[im 88/88]
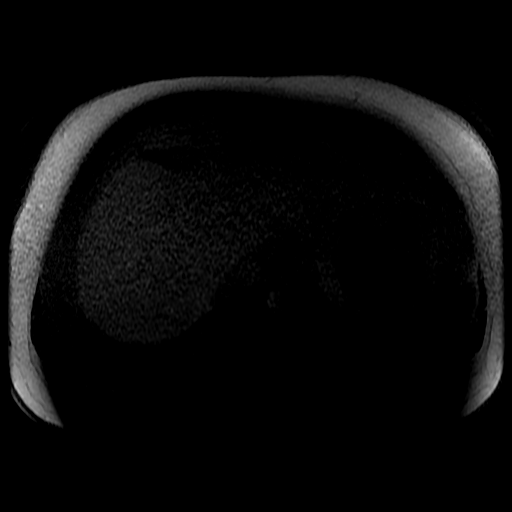

[22 of 48 positions shown; findings below may reference images not displayed]

FINDINGS: Lower chest: Normal heart size without pericardial or pleural
effusion.

Hepatobiliary: Scattered tiny hepatic cysts. Normal gallbladder,
without biliary ductal dilatation.

Pancreas: Normal, without mass or ductal dilatation.

Spleen: Normal

Adrenals/Urinary Tract: Normal adrenal glands. Bilateral renal
cysts. The indeterminate lower pole right renal lesion is consistent
with a simple cyst at 2.2 cm on image 72 of series 904. No
suspicious renal lesion or hydronephrosis.

Stomach/Bowel: Normal stomach and abdominal bowel loops.

Vascular/Lymphatic: Normal caliber of the aorta and branch vessels.
No retroperitoneal or retrocrural adenopathy.

Other: No ascites.

Musculoskeletal:  Right-sided T11 hemangioma.
IMPRESSION: 1. Bilateral renal cysts. The indeterminate lower pole right renal
lesion is consistent with a simple cyst. No suspicious renal lesion.
2.  No acute abdominal process.

## 2016-03-22 ENCOUNTER — Encounter: Payer: Self-pay | Admitting: Family Medicine

## 2016-03-22 ENCOUNTER — Ambulatory Visit (INDEPENDENT_AMBULATORY_CARE_PROVIDER_SITE_OTHER): Payer: BLUE CROSS/BLUE SHIELD | Admitting: Family Medicine

## 2016-03-22 VITALS — BP 130/82 | HR 74 | Temp 98.5°F | Ht 70.0 in | Wt 297.2 lb

## 2016-03-22 DIAGNOSIS — Z23 Encounter for immunization: Secondary | ICD-10-CM

## 2016-03-22 DIAGNOSIS — B029 Zoster without complications: Secondary | ICD-10-CM

## 2016-03-22 MED ORDER — GABAPENTIN 300 MG PO CAPS: 300.0000 mg | ORAL_CAPSULE | Freq: Three times a day (TID) | ORAL | 0 refills | Status: DC

## 2016-03-22 MED ORDER — VALACYCLOVIR HCL 1 G PO TABS: 1000.0000 mg | ORAL_TABLET | Freq: Three times a day (TID) | ORAL | 0 refills | Status: AC

## 2016-03-22 NOTE — Patient Instructions (Signed)
Try taking the gabapentin at night first. It can make people drowsy.

## 2016-03-22 NOTE — Progress Notes (Signed)
Pre visit review using our clinic review tool, if applicable. No additional management support is needed unless otherwise documented below in the visit note. 

## 2016-03-22 NOTE — Progress Notes (Signed)
Chief Complaint  Patient presents with  . Establish Care    Pt reports soreness on the RT side relating it to feeling like sun burn     Subjective: Patient is a 45 y.o. male here for soreness on back.  Suggest a, the patient has been expressing a burning sensation on his right mid back area. He has a history of shingles and states this feels very similar. His last case, it took 2 days before a rash erupted. It did start with burning that is he experiencing currently. He denies any injury, sun exposure, or new soaps, lotions, topicals, or detergents.  ROS: Skin: As noted in HPI  Family History  Problem Relation Age of Onset  . Cancer Mother     ovarian  . Multiple myeloma Father   . Cancer Maternal Uncle     PROSTATE  . Cancer Paternal Uncle     PROSTATE  . Parkinson's disease Paternal Uncle   . Heart disease Paternal Grandmother    Past Medical History:  Diagnosis Date  . Bladder mass   . Cancer ([place])   . Gross hematuria    No Known Allergies  Current Outpatient Prescriptions:  .  acetaminophen (TYLENOL) 500 MG tablet, Take 1,000 mg by mouth as needed., Disp: , Rfl:  .  polyethylene glycol (MIRALAX / GLYCOLAX) packet, Take 17 g by mouth as needed., Disp: , Rfl:  .  traMADol (ULTRAM) 50 MG tablet, Take 100 mg by mouth as needed., Disp: , Rfl:  .  gabapentin (NEURONTIN) 300 MG capsule, Take 1 capsule (300 mg total) by mouth 3 (three) times daily., Disp: 30 capsule, Rfl: 0 .  valACYclovir (VALTREX) 1000 MG tablet, Take 1 tablet (1,000 mg total) by mouth 3 (three) times daily., Disp: 21 tablet, Rfl: 0  Objective: BP 130/82 (BP Location: Right Arm, Patient Position: Sitting, Cuff Size: Large)   Pulse 74   Temp 98.5 F (36.9 C) (Oral)   Ht 5' 10"  (1.778 m)   Wt 297 lb 3.2 oz (134.8 kg)   SpO2 98%   BMI 42.64 kg/m  General: Awake, appears stated age HEENT: MMM, EOMi Heart: RRR, no murmurs Lungs: CTAB, no rales, wheezes or rhonchi. No accessory muscle use Skin: There is  no lesion or rash over the area of interest. Some TTP over the R side of back near angle of rib, approx R 8-9. Psych: Age appropriate judgment and insight, normal affect and mood  Assessment and Plan: Herpes zoster without complication - Plan: valACYclovir (VALTREX) 1000 MG tablet, gabapentin (NEURONTIN) 300 MG capsule  Encounter for immunization - Plan: Flu Vaccine QUAD 36+ mos IM  Orders as above. Given his history, will treat for shingles. Follow-up as needed. The patient voiced understanding and agreement to the plan.  [place], DO 03/22/16  3:00 PM

## 2016-04-27 ENCOUNTER — Emergency Department (HOSPITAL_BASED_OUTPATIENT_CLINIC_OR_DEPARTMENT_OTHER)
Admission: EM | Admit: 2016-04-27 | Discharge: 2016-04-27 | Disposition: A | Discharge status: Home / Self Care | Payer: BLUE CROSS/BLUE SHIELD | Attending: Emergency Medicine | Admitting: Emergency Medicine

## 2016-04-27 ENCOUNTER — Encounter (HOSPITAL_BASED_OUTPATIENT_CLINIC_OR_DEPARTMENT_OTHER): Payer: Self-pay

## 2016-04-27 DIAGNOSIS — K644 Residual hemorrhoidal skin tags: Secondary | ICD-10-CM | POA: Diagnosis not present

## 2016-04-27 DIAGNOSIS — K921 Melena: Secondary | ICD-10-CM

## 2016-04-27 DIAGNOSIS — Z87891 Personal history of nicotine dependence: Secondary | ICD-10-CM | POA: Insufficient documentation

## 2016-04-27 DIAGNOSIS — I10 Essential (primary) hypertension: Secondary | ICD-10-CM | POA: Diagnosis not present

## 2016-04-27 LAB — CBC WITH DIFFERENTIAL/PLATELET
BASOS ABS: 0 10*3/uL (ref 0.0–0.1)
Basophils Relative: 0 %
EOS PCT: 2 %
Eosinophils Absolute: 0.1 10*3/uL (ref 0.0–0.7)
HCT: 36.1 % — ABNORMAL LOW (ref 39.0–52.0)
Hemoglobin: 11.9 g/dL — ABNORMAL LOW (ref 13.0–17.0)
LYMPHS PCT: 10 %
Lymphs Abs: 0.7 10*3/uL (ref 0.7–4.0)
MCH: 28.4 pg (ref 26.0–34.0)
MCHC: 33 g/dL (ref 30.0–36.0)
MCV: 86.2 fL (ref 78.0–100.0)
MONO ABS: 0.4 10*3/uL (ref 0.1–1.0)
Monocytes Relative: 7 %
Neutro Abs: 5.2 10*3/uL (ref 1.7–7.7)
Neutrophils Relative %: 81 %
PLATELETS: 187 10*3/uL (ref 150–400)
RBC: 4.19 MIL/uL — ABNORMAL LOW (ref 4.22–5.81)
RDW: 12.4 % (ref 11.5–15.5)
WBC: 6.4 10*3/uL (ref 4.0–10.5)

## 2016-04-27 LAB — BASIC METABOLIC PANEL
Anion gap: 8 (ref 5–15)
BUN: 29 mg/dL — AB (ref 6–20)
CALCIUM: 8.9 mg/dL (ref 8.9–10.3)
CO2: 23 mmol/L (ref 22–32)
Chloride: 109 mmol/L (ref 101–111)
Creatinine, Ser: 2.3 mg/dL — ABNORMAL HIGH (ref 0.61–1.24)
GFR calc Af Amer: 37 mL/min — ABNORMAL LOW (ref >60)
GFR, EST NON AFRICAN AMERICAN: 32 mL/min — AB (ref >60)
GLUCOSE: 116 mg/dL — AB (ref 65–99)
Potassium: 4.2 mmol/L (ref 3.5–5.1)
Sodium: 140 mmol/L (ref 135–145)

## 2016-04-27 NOTE — ED Triage Notes (Signed)
Pt states had a bloody stool this am, denies pain, states hx of some constipation with straining

## 2016-04-27 NOTE — ED Notes (Signed)
ED Provider at bedside. 

## 2016-04-27 NOTE — ED Provider Notes (Signed)
[place] [facility] Provider Note   CSN: [id number] Arrival date & time: 04/27/16  8309     History   Chief Complaint Chief Complaint  Patient presents with  . Blood In Stools    HPI Jesus Soto is a 46 y.o. male.  The history is provided by the patient.  GI Problem  This is a new problem. The current episode started 3 to 5 hours ago. Episode frequency: twice. The problem has not changed since onset.Pertinent negatives include no chest pain, no abdominal pain, no headaches and no shortness of breath. Exacerbated by: BM. Nothing relieves the symptoms. He has tried nothing for the symptoms. The treatment provided no relief.   Patient has a history of bladder cancer that was resected as well as a partial colectomy. Patient has been on narcotics for pain management and reports having complication with constipation and having to strain during bowel movements. Patient has discussed symptoms with primary care provider who recommended MiraLAX however has not started the regimen yet. Past Medical History:  Diagnosis Date  . Bladder mass   . Cancer ([place])   . Gross hematuria     Patient Active Problem List   Diagnosis Date Noted  . Peripheral edema 10/07/2014  . Bladder mass 09/25/2014  . Essential hypertension, benign 06/06/2013  . Hypertriglyceridemia 06/06/2013    Past Surgical History:  Procedure Laterality Date  . CYSTOSCOPY WITH BIOPSY N/A 09/25/2014   Procedure: CYSTOSCOPY WITH BIOPSY;  Surgeon: Rana Snare, MD;  Location: [facility];  Service: Urology;  Laterality: N/A;  . TONSILLECTOMY AND ADENOIDECTOMY  2006   and Uvulectomy  . TRANSURETHRAL RESECTION OF BLADDER TUMOR N/A 09/25/2014   Procedure: TRANSURETHRAL RESECTION OF BLADDER TUMOR (TURBT);  Surgeon: Rana Snare, MD;  Location: [facility];  Service: Urology;  Laterality: N/A;  . WISDOM TOOTH EXTRACTION  age 50       Home Medications    Prior to Admission medications    Medication Sig Start Date End Date Taking? Authorizing Provider  acetaminophen (TYLENOL) 500 MG tablet Take 1,000 mg by mouth as needed.    Historical Provider, MD  polyethylene glycol (MIRALAX / GLYCOLAX) packet Take 17 g by mouth as needed. 02/08/16   Historical Provider, MD    Family History Family History  Problem Relation Age of Onset  . Cancer Mother     ovarian  . Multiple myeloma Father   . Cancer Maternal Uncle     PROSTATE  . Cancer Paternal Uncle     PROSTATE  . Parkinson's disease Paternal Uncle   . Heart disease Paternal Grandmother     Social History Social History  Substance Use Topics  . Smoking status: Former Smoker    Packs/day: 0.50    Years: 25.00    Types: Cigarettes    Quit date: 10/04/2014  . Smokeless tobacco: Never Used     Comment: Recently Quit  . Alcohol use 0.0 oz/week     Comment: rare     Allergies   Patient has no known allergies.   Review of Systems Review of Systems  Respiratory: Negative for shortness of breath.   Cardiovascular: Negative for chest pain.  Gastrointestinal: Negative for abdominal pain.  Neurological: Negative for headaches.   Ten systems are reviewed and are negative for acute change except as noted in the HPI  Physical Exam Updated Vital Signs BP 143/96 (BP Location: Right Arm)   Pulse 78   Temp 98.4 F (36.9 C) (Oral)  Resp 18   Ht 5' 10"  (1.778 m)   Wt 295 lb (133.8 kg)   SpO2 98%   BMI 42.33 kg/m   Physical Exam  Constitutional: He is oriented to person, place, and time. He appears well-developed and well-nourished. No distress.  HENT:  Head: Normocephalic and atraumatic.  Nose: Nose normal.  Eyes: Conjunctivae and EOM are normal. Pupils are equal, round, and reactive to light. Right eye exhibits no discharge. Left eye exhibits no discharge. No scleral icterus.  Neck: Normal range of motion. Neck supple.  Cardiovascular: Normal rate and regular rhythm.  Exam reveals no gallop and no friction  rub.   No murmur heard. Pulmonary/Chest: Effort normal and breath sounds normal. No stridor. No respiratory distress. He has no rales.  Abdominal: Soft. He exhibits no distension. There is tenderness (mild discomfort) in the suprapubic area and left lower quadrant. There is no rigidity, no rebound, no guarding and no CVA tenderness.  Genitourinary: Rectal exam shows external hemorrhoid and internal hemorrhoid (palpable).  Genitourinary Comments: Light brown stool coated with red blood.  Musculoskeletal: He exhibits no edema or tenderness.  Neurological: He is alert and oriented to person, place, and time.  Skin: Skin is warm and dry. No rash noted. He is not diaphoretic. No erythema.  Psychiatric: He has a normal mood and affect.  Vitals reviewed.    ED Treatments / Results  Labs (all labs ordered are listed, but only abnormal results are displayed) Labs Reviewed  BASIC METABOLIC PANEL - Abnormal; Notable for the following:       Result Value   Glucose, Bld 116 (*)    BUN 29 (*)    Creatinine, Ser 2.30 (*)    GFR calc non Af Amer 32 (*)    GFR calc Af Amer 37 (*)    All other components within normal limits  CBC WITH DIFFERENTIAL/PLATELET - Abnormal; Notable for the following:    RBC 4.19 (*)    Hemoglobin 11.9 (*)    HCT 36.1 (*)    All other components within normal limits    EKG  EKG Interpretation None       Radiology No results found.  Procedures Procedures (including critical care time)  Medications Ordered in ED Medications - No data to display   Initial Impression / Assessment and Plan / ED Course  I have reviewed the triage vital signs and the nursing notes.  Pertinent labs & imaging results that were available during my care of the patient were reviewed by me and considered in my medical decision making (see chart for details).  Clinical Course     Labs at baseline and grossly reassuring without evidence of leukocytosis or significant anemia. Exam  consistent with external hemorrhoids and likely palpable internal hemorrhoids which is likely the cause of the patient's bleeding given his history of constipation. Low suspicion for diverticulitis at this time. Discussed findings with patient's and option of having a CAT scan performed. With she decision-making we decided to sustain from up pain in the CT scan at this time. The patient is safe for discharge with strict return precautions with close PCP follow up.   Final Clinical Impressions(s) / ED Diagnoses   Final diagnoses:  Hematochezia  External hemorrhoid   Disposition: Discharge  Condition: Good  I have discussed the results, Dx and Tx plan with the patient who expressed understanding and agree(s) with the plan. Discharge instructions discussed at great length. The patient was given strict return precautions who  verbalized understanding of the instructions. No further questions at time of discharge.    Discharge Medication List as of 04/27/2016 10:05 AM      Follow Up: Primary care provider  Schedule an appointment as soon as possible for a visit  in 3-5 days, If symptoms do not improve or  worsen      Fatima Blank, MD 04/27/16 1625

## 2016-04-27 NOTE — ED Notes (Signed)
Dr Leonette Monarch at bedside for rectal exam with RN chaperone. VORB to d/c occult blood card

## 2016-11-17 DEATH — deceased

## 2017-09-01 ENCOUNTER — Encounter: Payer: Self-pay | Admitting: Emergency Medicine
